# Patient Record
Sex: Male | Born: 1945 | Race: White | Hispanic: No | Marital: Married | Smoking: Never smoker
Health system: Southern US, Community
[De-identification: ages and names within clinical notes are randomized; demographics above are authoritative.]

## PROBLEM LIST (undated history)

## (undated) ENCOUNTER — Emergency Department (HOSPITAL_COMMUNITY): Admission: EM | Payer: Managed Care, Other (non HMO) | Source: Home / Self Care

## (undated) DIAGNOSIS — I10 Essential (primary) hypertension: Secondary | ICD-10-CM

## (undated) DIAGNOSIS — I219 Acute myocardial infarction, unspecified: Secondary | ICD-10-CM

## (undated) DIAGNOSIS — G459 Transient cerebral ischemic attack, unspecified: Secondary | ICD-10-CM

## (undated) DIAGNOSIS — Z86010 Personal history of colonic polyps: Secondary | ICD-10-CM

## (undated) DIAGNOSIS — Z9289 Personal history of other medical treatment: Secondary | ICD-10-CM

## (undated) DIAGNOSIS — I2510 Atherosclerotic heart disease of native coronary artery without angina pectoris: Secondary | ICD-10-CM

## (undated) DIAGNOSIS — I213 ST elevation (STEMI) myocardial infarction of unspecified site: Secondary | ICD-10-CM

## (undated) DIAGNOSIS — E785 Hyperlipidemia, unspecified: Secondary | ICD-10-CM

## (undated) DIAGNOSIS — E119 Type 2 diabetes mellitus without complications: Secondary | ICD-10-CM

## (undated) HISTORY — DX: Transient cerebral ischemic attack, unspecified: G45.9

## (undated) HISTORY — PX: COLONOSCOPY: SHX174

## (undated) HISTORY — PX: CORONARY ANGIOPLASTY WITH STENT PLACEMENT: SHX49

## (undated) HISTORY — DX: Personal history of other medical treatment: Z92.89

## (undated) HISTORY — DX: Personal history of colonic polyps: Z86.010

## (undated) HISTORY — DX: Hyperlipidemia, unspecified: E78.5

## (undated) HISTORY — DX: Essential (primary) hypertension: I10

## (undated) HISTORY — DX: Atherosclerotic heart disease of native coronary artery without angina pectoris: I25.10

## (undated) HISTORY — DX: Acute myocardial infarction, unspecified: I21.9

---

## 1997-09-07 ENCOUNTER — Encounter: Admission: RE | Admit: 1997-09-07 | Discharge: 1997-09-07 | Payer: Self-pay | Admitting: *Deleted

## 1998-12-22 ENCOUNTER — Encounter: Admission: RE | Admit: 1998-12-22 | Discharge: 1998-12-22 | Payer: Self-pay | Admitting: *Deleted

## 2000-04-08 DIAGNOSIS — G459 Transient cerebral ischemic attack, unspecified: Secondary | ICD-10-CM

## 2000-04-08 HISTORY — DX: Transient cerebral ischemic attack, unspecified: G45.9

## 2000-05-03 ENCOUNTER — Inpatient Hospital Stay (HOSPITAL_COMMUNITY): Admission: EM | Admit: 2000-05-03 | Discharge: 2000-05-06 | Payer: Self-pay

## 2000-05-30 ENCOUNTER — Encounter: Payer: Self-pay | Admitting: Cardiology

## 2000-05-30 ENCOUNTER — Ambulatory Visit (HOSPITAL_COMMUNITY): Admission: RE | Admit: 2000-05-30 | Discharge: 2000-05-30 | Payer: Self-pay

## 2005-04-08 HISTORY — PX: HERNIA REPAIR: SHX51

## 2005-12-16 ENCOUNTER — Ambulatory Visit: Payer: Self-pay | Admitting: Cardiology

## 2005-12-20 ENCOUNTER — Ambulatory Visit: Payer: Self-pay | Admitting: Cardiology

## 2007-06-27 ENCOUNTER — Emergency Department (HOSPITAL_COMMUNITY): Admission: EM | Admit: 2007-06-27 | Discharge: 2007-06-27 | Payer: Self-pay | Admitting: Emergency Medicine

## 2007-10-07 ENCOUNTER — Ambulatory Visit: Payer: Self-pay | Admitting: Internal Medicine

## 2007-10-07 ENCOUNTER — Encounter: Payer: Self-pay | Admitting: Physician Assistant

## 2007-10-07 LAB — CONVERTED CEMR LAB
Cholesterol: 165 mg/dL (ref 0–200)
Direct LDL: 106.8 mg/dL
HDL: 23.8 mg/dL — ABNORMAL LOW (ref >39.0)
Total CHOL/HDL Ratio: 6.9
Triglycerides: 208 mg/dL (ref 0–149)
VLDL: 42 mg/dL — ABNORMAL HIGH (ref 0–40)

## 2007-10-16 ENCOUNTER — Ambulatory Visit: Payer: Self-pay

## 2007-11-02 ENCOUNTER — Ambulatory Visit: Payer: Self-pay | Admitting: Cardiology

## 2008-06-03 DIAGNOSIS — I2510 Atherosclerotic heart disease of native coronary artery without angina pectoris: Secondary | ICD-10-CM

## 2008-06-03 DIAGNOSIS — R6882 Decreased libido: Secondary | ICD-10-CM

## 2008-06-03 DIAGNOSIS — E785 Hyperlipidemia, unspecified: Secondary | ICD-10-CM

## 2008-06-03 DIAGNOSIS — I10 Essential (primary) hypertension: Secondary | ICD-10-CM | POA: Insufficient documentation

## 2008-06-03 DIAGNOSIS — R42 Dizziness and giddiness: Secondary | ICD-10-CM | POA: Insufficient documentation

## 2009-08-22 ENCOUNTER — Ambulatory Visit (HOSPITAL_COMMUNITY): Admission: RE | Admit: 2009-08-22 | Discharge: 2009-08-22 | Payer: Self-pay | Admitting: Orthopedic Surgery

## 2009-12-04 ENCOUNTER — Telehealth: Payer: Self-pay | Admitting: Cardiology

## 2010-01-25 ENCOUNTER — Emergency Department (HOSPITAL_COMMUNITY): Admission: EM | Admit: 2010-01-25 | Discharge: 2010-01-26 | Payer: Self-pay | Admitting: Emergency Medicine

## 2010-01-30 ENCOUNTER — Ambulatory Visit: Payer: Self-pay | Admitting: Cardiovascular Disease

## 2010-01-30 ENCOUNTER — Inpatient Hospital Stay (HOSPITAL_COMMUNITY): Admission: EM | Admit: 2010-01-30 | Discharge: 2010-02-01 | Payer: Self-pay | Admitting: Emergency Medicine

## 2010-02-06 DEATH — deceased

## 2010-03-06 ENCOUNTER — Ambulatory Visit: Payer: Self-pay | Admitting: Cardiology

## 2010-03-29 ENCOUNTER — Telehealth: Payer: Self-pay | Admitting: Cardiology

## 2010-05-10 NOTE — Assessment & Plan Note (Signed)
Summary: f2y  Medications Added HYDROCHLOROTHIAZIDE 12.5 MG TABS (HYDROCHLOROTHIAZIDE) Take one tablet by mouth daily. NITROSTAT 0.4 MG SUBL (NITROGLYCERIN) 1 tablet under tongue at onset of chest pain; you may repeat every 5 minutes for up to 3 doses. ASPIRIN EC 325 MG TBEC (ASPIRIN) Take one tablet by mouth daily PLAVIX 75 MG TABS (CLOPIDOGREL BISULFATE) Take one tablet by mouth daily      Allergies Added: NKDA  Visit Type:  2 years follow up  CC:  Chest pains.  History of Present Illness: Mr. [name] is a 65 year old patient whom I have not seen since 2009.  He has had prior PCI of his right coronary artery in 2002.  At that time, his ejection fraction was 45-50%. Last Myoview  was performed on July 10,2009.  His ejection fraction was 61%.  There was a prior inferobasal thinning versus infarct, and there was mild inferobasal ischemia and mild apical lateral ischemia.  It was felt to be a low-risk study. Patient admitted to [facility] in October of 2011 with chest pain. Troponin mildly elevated. He underwent cardiac catheterization which revealed occlusion of the distal left circumflex. There was nonobstructive disease in the LAD. The right coronary artery revealed a 20% stenosis proximally.  In the distal RCA, there was a 95% tubular stenosis just before the previously placed stent.  The stent itself is patent with mild in-stent restenosis.  The PDA is large size with diffuse disease in the mid and distal segment with multiple lesions ranging from 90% to 95%.  First PL is small size.  Second PL is large sized and has an 80% proximal stenosis. LV function was normal. The patient had PCI of the right coronary artery with a bare-metal stent. He had PCI of the posterior lateral with a drug-eluting stent. Since then the patient denies any dyspnea on exertion, orthopnea, PND, pedal edema, palpitations, syncope or chest pain.    Current Medications (verified): 1)  Hydrochlorothiazide 12.5  Mg Tabs (Hydrochlorothiazide) .... Take One Tablet By Mouth Daily. 2)  Metoprolol Tartrate 50 Mg Tabs (Metoprolol Tartrate) .... Take One Tablet By Mouth Twice A Day 3)  Nitrostat 0.4 Mg Subl (Nitroglycerin) .Marland Kitchen.. 1 Tablet Under Tongue At Onset of Chest Pain; You May Repeat Every 5 Minutes For Up To 3 Doses. 4)  Aspirin Ec 325 Mg Tbec (Aspirin) .... Take One Tablet By Mouth Daily 5)  Plavix 75 Mg Tabs (Clopidogrel Bisulfate) .... Take One Tablet By Mouth Daily  Allergies (verified): No Known Drug Allergies  Past History:  Past Medical History: CAD, NATIVE VESSEL (ICD-414.01) HYPERTENSION, UNSPECIFIED (ICD-401.9) HYPERLIPIDEMIA-MIXED (ICD-272.4) LIBIDO, DECREASED (ICD-799.81)  Past Surgical History: Hernia repair  Social History: Reviewed history from 06/03/2008 and no changes required. Delivery driver Married  Tobacco Use - No.  Alcohol Use - no Regular Exercise - no  Review of Systems       no fevers or chills, productive cough, hemoptysis, dysphasia, odynophagia, melena, hematochezia, dysuria, hematuria, rash, seizure activity, orthopnea, PND, pedal edema, claudication. Remaining systems are negative.   Vital Signs:  Patient profile:   65 year old male Height:      72 inches Weight:      269.25 pounds BMI:     36.65 Pulse rate:   70 / minute Pulse rhythm:   regular Resp:     18 per minute BP sitting:   148 / 84  (left arm) Cuff size:   large  Vitals Entered By: Vikki Ports (March 06, 2010 10:11 AM)  Physical Exam  General:  Well-developed well-nourished in no acute distress.  Skin is warm and dry.  HEENT is normal.  Neck is supple. No thyromegaly.  Chest is clear to auscultation with normal expansion.  Cardiovascular exam is regular rate and rhythm.  Abdominal exam nontender or distended. No masses palpated. Extremities show no edema. neuro grossly intact    EKG  Procedure date:  03/06/2010  Findings:      Sinus rhythm at a rate of 67. Axis  normal. Nonspecific ST changes. Cannot rule out prior inferior infarct. First degree AV block.  Impression & Recommendations:  Problem # 1:  CAD, NATIVE VESSEL (ICD-414.01) Continue aspirin, Plavix, beta blocker. Intolerant to statins. He discontinued his Zocor secondary to myalgias and has not tolerated Lipitor or Crestor in the past. His updated medication list for this problem includes:    Metoprolol Tartrate 50 Mg Tabs (Metoprolol tartrate) .Marland Kitchen... Take one tablet by mouth twice a day    Nitrostat 0.4 Mg Subl (Nitroglycerin) .Marland Kitchen... 1 tablet under tongue at onset of chest pain; you may repeat every 5 minutes for up to 3 doses.    Aspirin Ec 325 Mg Tbec (Aspirin) .Marland Kitchen... Take one tablet by mouth daily    Plavix 75 Mg Tabs (Clopidogrel bisulfate) .Marland Kitchen... Take one tablet by mouth daily  Orders: EKG w/ Interpretation (93000)  Problem # 2:  HYPERTENSION, UNSPECIFIED (ICD-401.9) Blood pressure mildly elevated. He will follow this at home. If systolic greater than 130 or diastolic greater than 85 we will add additional medications. His updated medication list for this problem includes:    Hydrochlorothiazide 12.5 Mg Tabs (Hydrochlorothiazide) .Marland Kitchen... Take one tablet by mouth daily.    Metoprolol Tartrate 50 Mg Tabs (Metoprolol tartrate) .Marland Kitchen... Take one tablet by mouth twice a day    Aspirin Ec 325 Mg Tbec (Aspirin) .Marland Kitchen... Take one tablet by mouth daily  Problem # 3:  HYPERLIPIDEMIA-MIXED (ICD-272.4) Continue diet. Intolerant to statins.  Patient Instructions: 1)  Your physician recommends that you schedule a follow-up appointment in: 6 MONTHS WITH DR CRENSHAW 2)  Your physician recommends that you continue on your current medications as directed. Please refer to the Current Medication list given to you today.

## 2010-05-10 NOTE — Progress Notes (Signed)
Summary: dizziness when pt lays down  Phone Note Call from Patient Call back at [phone]   Caller: Spouse/patt Reason for Call: Talk to Nurse Summary of Call: Pt wife pat states pt feel sick/dizzy every time he lays down.  Initial call taken by: Roe Coombs,  March 29, 2010 9:17 AM  Follow-up for Phone Call        I talked with pt's wife--wife states night before last pt had dizziness laying in bed when he turned his head and became nauseated--wife states  pt denies any other symptoms including  chest pain/tightness/SOB/Headache/vision problems/ bleeding problems--wife states pt actually feels better today-he has not had any virus/flu symptoms and has been eating and drinking normally--wife states pt does not have a way to check his B/P --I reviewed with Dr Danie Chandler will have pt followup with PCP or Urgent Care

## 2010-05-10 NOTE — Progress Notes (Signed)
Summary: refill   Phone Note Refill Request Message from:  Patient on December 04, 2009 9:31 AM  Refills Requested: Medication #1:  HYDROCHLOROTHIAZIDE 25 MG TABS Take one tablet by mouth daily.  Medication #2:  METOPROLOL TARTRATE 50 MG TABS Take one tablet by mouth twice a day. [facility] [phone]  Initial call taken by: Judie Grieve,  December 04, 2009 9:31 AM    Prescriptions: METOPROLOL TARTRATE 50 MG TABS (METOPROLOL TARTRATE) Take one tablet by mouth twice a day  #60 x 12   Entered by:   Kem Parkinson   Authorized by:   Ferman Hamming, MD, [facility]   Signed by:   Kem Parkinson on 12/04/2009   Method used:   Electronically to        [facility]* (retail)       [address]       [place], [place]  [address]       Ph: [phone]       Fax: [phone]   RxID:   [id number] HYDROCHLOROTHIAZIDE 25 MG TABS (HYDROCHLOROTHIAZIDE) Take one tablet by mouth daily.  #30 x 12   Entered by:   Kem Parkinson   Authorized by:   Ferman Hamming, MD, [facility]   Signed by:   Kem Parkinson on 12/04/2009   Method used:   Electronically to        [facility] (retail)       [address]       [place], [place]  [address]       Ph: [phone]       Fax: [phone]   RxID:   [id number]

## 2010-05-16 ENCOUNTER — Telehealth: Payer: Self-pay | Admitting: Cardiology

## 2010-05-24 NOTE — Progress Notes (Signed)
Summary: refill  Phone Note Refill Request Message from:  Patient on May 16, 2010 9:30 AM  Refills Requested: Medication #1:  PLAVIX 75 MG TABS Take one tablet by mouth daily. Send to [facility] [phone]   Follow-up for Phone Call        faxed to pharmacy Follow-up by: Kem Parkinson,  May 17, 2010 2:34 PM    Prescriptions: PLAVIX 75 MG TABS (CLOPIDOGREL BISULFATE) Take one tablet by mouth daily  #90 x 3   Entered by:   Kem Parkinson   Authorized by:   Ferman Hamming, MD, [facility]   Signed by:   Kem Parkinson on 05/17/2010   Method used:   Printed then faxed to ...       [facility] (retail)       [address]       [place], [place]  [address]       Ph: [phone]       Fax: [phone]   RxID:   [id number] PLAVIX 75 MG TABS (CLOPIDOGREL BISULFATE) Take one tablet by mouth daily  #90 x 3   Entered by:   Kem Parkinson   Authorized by:   Ferman Hamming, MD, [facility]   Signed by:   Kem Parkinson on 05/17/2010   Method used:   Reprint   RxID:   [id number] PLAVIX 75 MG TABS (CLOPIDOGREL BISULFATE) Take one tablet by mouth daily  #90 x 3   Entered by:   Kem Parkinson   Authorized by:   Ferman Hamming, MD, [facility]   Signed by:   Kem Parkinson on 05/17/2010   Method used:   Handwritten   RxID:   [id number]

## 2010-06-20 LAB — BASIC METABOLIC PANEL
BUN: 17 mg/dL (ref 6–23)
CO2: 27 mEq/L (ref 19–32)
Calcium: 8.5 mg/dL (ref 8.4–10.5)
Calcium: 9 mg/dL (ref 8.4–10.5)
Calcium: 9.4 mg/dL (ref 8.4–10.5)
Chloride: 99 mEq/L (ref 96–112)
Creatinine, Ser: 1.26 mg/dL (ref 0.4–1.5)
Creatinine, Ser: 1.28 mg/dL (ref 0.4–1.5)
Creatinine, Ser: 1.42 mg/dL (ref 0.4–1.5)
GFR calc Af Amer: >60 mL/min (ref >60)
GFR calc Af Amer: >60 mL/min (ref >60)
GFR calc Af Amer: >60 mL/min (ref >60)
GFR calc non Af Amer: 56 mL/min — ABNORMAL LOW (ref >60)
GFR calc non Af Amer: 57 mL/min — ABNORMAL LOW (ref >60)
Sodium: 138 mEq/L (ref 135–145)
Sodium: 140 mEq/L (ref 135–145)

## 2010-06-20 LAB — CBC
HCT: 42.9 % (ref 39.0–52.0)
Hemoglobin: 13.1 g/dL (ref 13.0–17.0)
Hemoglobin: 14.4 g/dL (ref 13.0–17.0)
MCH: 29.7 pg (ref 26.0–34.0)
MCH: 29.9 pg (ref 26.0–34.0)
MCH: 30.2 pg (ref 26.0–34.0)
MCHC: 33.6 g/dL (ref 30.0–36.0)
MCV: 86.9 fL (ref 78.0–100.0)
MCV: 89.2 fL (ref 78.0–100.0)
Platelets: 131 K/uL — ABNORMAL LOW (ref 150–400)
Platelets: 133 10*3/uL — ABNORMAL LOW (ref 150–400)
Platelets: 139 10*3/uL — ABNORMAL LOW (ref 150–400)
RBC: 4.41 MIL/uL (ref 4.22–5.81)
RBC: 4.67 MIL/uL (ref 4.22–5.81)
RBC: 4.81 MIL/uL (ref 4.22–5.81)
RDW: 12.8 % (ref 11.5–15.5)
RDW: 12.9 % (ref 11.5–15.5)
WBC: 5.8 K/uL (ref 4.0–10.5)
WBC: 6.2 10*3/uL (ref 4.0–10.5)
WBC: 6.6 10*3/uL (ref 4.0–10.5)

## 2010-06-20 LAB — DIFFERENTIAL
Basophils Absolute: 0 10*3/uL (ref 0.0–0.1)
Basophils Absolute: 0 K/uL (ref 0.0–0.1)
Basophils Relative: 0 % (ref 0–1)
Eosinophils Absolute: 0.1 10*3/uL (ref 0.0–0.7)
Eosinophils Absolute: 0.1 K/uL (ref 0.0–0.7)
Eosinophils Relative: 2 % (ref 0–5)
Eosinophils Relative: 2 % (ref 0–5)
Lymphocytes Relative: 18 % (ref 12–46)
Lymphocytes Relative: 26 % (ref 12–46)
Lymphs Abs: 1.5 K/uL (ref 0.7–4.0)
Monocytes Absolute: 0.6 K/uL (ref 0.1–1.0)
Monocytes Relative: 10 % (ref 3–12)
Neutro Abs: 3.6 K/uL (ref 1.7–7.7)
Neutrophils Relative %: 62 % (ref 43–77)
Neutrophils Relative %: 72 % (ref 43–77)

## 2010-06-20 LAB — CK TOTAL AND CKMB (NOT AT ARMC): Relative Index: 2.4 (ref 0.0–2.5)

## 2010-06-20 LAB — POCT CARDIAC MARKERS
CKMB, poc: 2.6 ng/mL (ref 1.0–8.0)
Myoglobin, poc: 140 ng/mL (ref 12–200)
Myoglobin, poc: 147 ng/mL (ref 12–200)
Troponin i, poc: 0.07 ng/mL (ref 0.00–0.09)

## 2010-06-20 LAB — CARDIAC PANEL(CRET KIN+CKTOT+MB+TROPI)
CK, MB: 3.7 ng/mL (ref 0.3–4.0)
CK, MB: 5.1 ng/mL — ABNORMAL HIGH (ref 0.3–4.0)
Relative Index: 2.9 — ABNORMAL HIGH (ref 0.0–2.5)
Relative Index: 3 — ABNORMAL HIGH (ref 0.0–2.5)
Total CK: 122 U/L (ref 7–232)
Total CK: 148 U/L (ref 7–232)
Total CK: 174 U/L (ref 7–232)
Troponin I: 0.35 ng/mL — ABNORMAL HIGH (ref 0.00–0.06)

## 2010-06-20 LAB — PROTIME-INR
INR: 0.93 (ref 0.00–1.49)
Prothrombin Time: 12.7 seconds (ref 11.6–15.2)

## 2010-06-20 LAB — TROPONIN I: Troponin I: 0.3 ng/mL — ABNORMAL HIGH (ref 0.00–0.06)

## 2010-08-21 NOTE — Assessment & Plan Note (Signed)
[facility]                            CARDIOLOGY OFFICE NOTE   STRUMMER, [name]                       MRN:          [id number]  DATE:10/07/2007                            DOB:          04-30-1945    PRIMARY CARDIOLOGIST:  Madolyn Frieze. Jens Som, MD, [facility]   [name] is a pleasant 65 year old married white male, patient of Dr.  Jens Som, who has not been seen since September 2007.  He has a history  of coronary artery disease with prior inferior infarct and PCI of his  RCA.  His last nuclear study was in 2005 that showed inferior wall  ischemia, mid and basal level, but Dr. Jens Som did review this last  year and felt like it was low risk, and he has been treated medically.   The patient comes in today complaining of several-month history of  mainly dizziness, this occurs mostly when he is at rest and lasts less  than a minute.  He believes it is associated with his elevated blood  pressure.  He has no dizziness when he changes position or moves his  head, and it only happens once every 2-4 weeks.  He has not had any  syncope.  He denies any associated palpitations.  He denies chest pain.  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  He sometimes  gets out of breath when he does this, but he denies any chest pain,  tightness, pressure, dizziness, or presyncope associated with heavy  exertion.  When he saw Dr. Jens Som last, he was encouraged to resume  his Crestor, which he never did, and his Toprol was increased to 75 mg a  day, but he has only taken 25 b.i.d.  He has no primary care doctor.  The other issue he is having is decreased libido on the beta-blocker.   CURRENT MEDICATIONS:  1. Metoprolol 50 mg one half b.i.d.  2. Aspirin 81 mg daily.   PHYSICAL EXAMINATION:  GENERAL:  This is a very pleasant 65 year old  white male in no acute distress.  VITAL SIGNS:   Blood pressure 140/100, pulse 63, and weight 258.  NECK:  Without JVD, HJR, bruit, or thyroid enlargement.  LUNGS:  Clear anterior, posterior, and lateral.  HEART:  Regular rate and rhythm at 63 beats per minute.  Normal S1 and  S2.  Positive S4.  No murmur, rub, bruit, thrill, or heave noted.  ABDOMEN:  Obese.  Normoactive bowel sounds are heard throughout.  EXTREMITIES:  Without cyanosis, clubbing, or edema.  He has good distal  pulses.   IMPRESSION:  1. Dizziness, maybe related to hypertension.  2. Hypertension uncontrolled.  3. History of coronary artery disease status post myocardial      infarction treated with percutaneous coronary intervention and      stenting of his distal right coronary artery in 2002.  4. History of mixed hyperlipidemia.  5. Decreased libido on beta-blocker.   PLAN:  At this time, I talked to the patient  about switching his beta-  blocker to another antihypertensive, but I also feel that he should be  on it for his coronary artery disease.  At this time, he would like to  stay on a beta-blocker, and I have increased it to 50 mg b.i.d.  I have  also added hydrochlorothiazide 25 mg daily for better blood pressure  control, and I gave him a prescription for Crestor 10 mg daily.  We will  check a fasting lipid panel today.  I have asked him to decrease his  sodium intake and try to lose some weight.  We have scheduled stress  Cardiolite to be performed within the next couple weeks.  The patient  lost his job and will be losing his insurance shortly.  I have also told  the patient that he needs a primary care physician, which he will  consider.  He should see Dr. Jens Som back within the month after these  tests are performed.      [name], PA-C  Electronically Signed      [name], MD  Electronically Signed   ML/MedQ  DD: 10/07/2007  DT: 10/08/2007  Job #: [id number]

## 2010-08-21 NOTE — Assessment & Plan Note (Signed)
[facility]                            CARDIOLOGY OFFICE NOTE   NAME:BAYNESPranish, Akhavan                       MRN:          [id number]  DATE:11/02/2007                            DOB:          1946-02-03    Mr. Antos is a 65 year old patient whom I have not seen since 2007.  He  has had prior PCI of his right coronary artery in 2002.  At that time,  his ejection fraction was 45-50%.  He has not been seen in this office  since December 20, 2005, by myself.  However, he recently was  complaining of dizziness and was seen by [name] on October 07, 2007.  She scheduled him to have a Myoview which was performed on October 16, 2007.  His ejection fraction was 61%.  There was a prior inferobasal  thinning versus infarct, and there was mild inferobasal ischemia and  mild apical lateral ischemia.  It was felt to be a low-risk study.  Since then, he denies any chest pain, shortness of breath, palpitations,  or syncope.  There is no pedal edema.   His medications include,  1. Aspirin 81 mg p.o. daily.  2. Lopressor 50 mg p.o. b.i.d.  3. Hydrochlorothiazide 25 mg p.o. daily.  4. Fish oil.  5. Green tea.   PHYSICAL EXAMINATION:  VITAL SIGNS:  Today, blood pressure of 127/74 and  his pulse is 63.  He weighs 258 pounds.  HEENT:  Normal.  NECK:  Supple.  No bruits.  CHEST:  Clear.  CARDIOVASCULAR:  Regular rate and rhythm.  ABDOMEN:  No tenderness.  EXTREMITIES:  No edema.   DIAGNOSES:  1. Coronary artery disease - Mr. Zechman' stress Myoview is low risk.      We will continue with medical therapy to include his aspirin and      Lopressor.  I have stressed the importance of a statin.  He      apparently has had some myalgias with Lipitor in the past.  He will      consider Pravachol 40 mg p.o. daily; and if he is willing to take      this, then we will check lipids and liver in 6 weeks and I have      explained the importance of followup with laboratories.   He also      will continue with diet and exercise.  He does not smoke.  2. Hypertension - His blood pressure is much better compared to October 07, 2007.  At that time, Elon Jester had added hydrochlorothiazide and      increased his Lopressor.  I will check a BMET to follow his      potassium and renal function.  3. History of hyperlipidemia - He will consider Pravachol 40 mg p.o.      daily as outlined above.   We will see him back in 6 months.  Note, he is having insurance problems  and states that he may not be able to return in the future for followup.     Arlys John  S. Jens Som, MD, [facility]  Electronically Signed    BSC/MedQ  DD: 11/02/2007  DT: 11/03/2007  Job #: [id number]

## 2010-08-24 NOTE — Assessment & Plan Note (Signed)
[facility]                              CARDIOLOGY OFFICE NOTE   NAME:[name], [name]                       MRN:          [id number]  DATE:12/20/2005                            DOB:          04/20/1945    [name] is a gentleman who has a history of coronary disease with prior  inferior infarct and PCI of his right coronary artery.  His most recent  nuclear study was performed on Aug 24, 2003.  His ejection fraction was 59%.  Dr. Eden Emms interpreted the study as ischemia to mid and basal level.  I did  review this and felt it was low risk and we have been treating medically.  Since that time he denies any dyspnea, chest pain, palpitations, or syncope.  There is no claudication.   His medications include:  1. Toprol 50 mg p.o. q.day.  2. Aspirin 18 mg p.o. q.day.   PHYSICAL EXAM TODAY:  VITAL SIGNS:  Blood pressure 132/96.  Pulse 71.  Weight 243 pounds.  NECK:  Supple with no bruits.  CHEST:  Clear.  CARDIOVASCULAR EXAM:  Reveals a regular rate and rhythm.  ABDOMINAL EXAM:  Shows no pulsatile masses.  No bruits.  He has 2+ femoral  pulses bilaterally.  EXTREMITIES:  Show no edema.   His electrocardiogram shows a normal sinus rhythm at a rate of 71.  The axis  is normal.  There are no ST changes noted.   DIAGNOSES:  1. History of coronary disease.  2. Hypertension.  3. History of mild hyperlipidemia.   PLAN:  [name] is doing well from a symptomatic standpoint.  I did offer  a repeat nuclear study today but he declined.  His blood pressure is  elevated.  I have asked him to increase his Toprol to 75 mg p.o. daily.  I  have also asked him to purchase a home blood pressure monitor and if his  blood pressure continues to run high, then he will contact us (we would like  his systolic to be less than 130 and his diastolic less than 80).  He is  also not on a Statin and has NOT TOLERATED ANY STATIN BUT CRESTOR in the  past.  He discontinued  this medication on his own for unclear reasons.  I  have asked him to begin Crestor 10 mg every other day.  He is not  clear that he will agree to this.  If so, we will ask him to return for a  fasting lipids and liver in six weeks.  We discussed the importance of diet  and exercise.  He will see [place] back in 12 months.                              Madolyn Frieze Jens Som, MD, [facility]    BSC/MedQ  DD:  12/20/2005  DT:  12/21/2005  Job #:  [id number]

## 2010-09-10 ENCOUNTER — Telehealth: Payer: Self-pay | Admitting: Cardiology

## 2010-09-10 MED ORDER — CLOPIDOGREL BISULFATE 75 MG PO TABS: 75.0000 mg | ORAL_TABLET | Freq: Every day | ORAL | Status: DC

## 2010-09-10 NOTE — Telephone Encounter (Signed)
Refill medication- plavix 75 mg [facility] [phone].

## 2010-12-19 ENCOUNTER — Other Ambulatory Visit: Payer: Self-pay | Admitting: *Deleted

## 2010-12-19 MED ORDER — METOPROLOL TARTRATE 50 MG PO TABS: 50.0000 mg | ORAL_TABLET | Freq: Two times a day (BID) | ORAL | Status: DC

## 2011-01-02 ENCOUNTER — Telehealth: Payer: Self-pay | Admitting: Cardiology

## 2011-01-02 MED ORDER — METOPROLOL TARTRATE 50 MG PO TABS: 50.0000 mg | ORAL_TABLET | Freq: Two times a day (BID) | ORAL | Status: DC

## 2011-01-02 NOTE — Telephone Encounter (Signed)
Pt is calling back about refill request he left earlier he is totally out of pills he is upset

## 2011-01-02 NOTE — Telephone Encounter (Signed)
Pt calling needing hydrochlorothizide 25 mg called in. Pt was told to take 1/2 pill per day pt would like to know if he could get 12.5 mg tablets   Pt would like one year supply of RX.   Pt would also like one year supply of metoprolol 50 mg called in.

## 2011-01-03 ENCOUNTER — Other Ambulatory Visit: Payer: Self-pay | Admitting: *Deleted

## 2011-01-03 MED ORDER — HYDROCHLOROTHIAZIDE 25 MG PO TABS: 25.0000 mg | ORAL_TABLET | Freq: Every day | ORAL | Status: DC

## 2011-01-07 ENCOUNTER — Ambulatory Visit (INDEPENDENT_AMBULATORY_CARE_PROVIDER_SITE_OTHER): Payer: Medicare Other | Admitting: General Surgery

## 2011-01-07 ENCOUNTER — Ambulatory Visit
Admission: RE | Admit: 2011-01-07 | Discharge: 2011-01-07 | Disposition: A | Discharge status: Home / Self Care | Payer: Medicare Other | Source: Ambulatory Visit | Attending: General Surgery | Admitting: General Surgery

## 2011-01-07 ENCOUNTER — Other Ambulatory Visit (INDEPENDENT_AMBULATORY_CARE_PROVIDER_SITE_OTHER): Payer: Self-pay | Admitting: General Surgery

## 2011-01-07 ENCOUNTER — Encounter (INDEPENDENT_AMBULATORY_CARE_PROVIDER_SITE_OTHER): Payer: Self-pay | Admitting: General Surgery

## 2011-01-07 VITALS — BP 180/102 | HR 60 | Temp 98.2°F | Resp 12 | Ht 72.0 in | Wt 269.8 lb

## 2011-01-07 DIAGNOSIS — R1032 Left lower quadrant pain: Secondary | ICD-10-CM

## 2011-01-07 NOTE — Progress Notes (Signed)
Chief Complaint  Patient presents with  . Other    new pt- eval [facility]    HPI [name]. is a 65 y.o. male.   HPI This is a 65 year old male who had a left inguinal hernia repair with mesh in 2007 and aspirin. This was done as he had left groin pain with a hernia preoperatively. He said that the surgeon told him the hernia was much bigger than what he thought. He did well for a while postoperatively. He began developing left groin pain that has been present after that. This is been present for some time now. It is an occasional intermittent pain he describes. Occasionally this is worse when he is having intercourse. He also states that over the last week this has gotten acutely worse and has been more painful and more tender. He doesn't realize an event cause this. He does not describe a bulge in his groin at this point. Past Medical History  Diagnosis Date  . CHF (congestive heart failure)   . Hyperlipidemia   . Hypertension   . Heart attack 2011  . TIA (transient ischemic attack) 2002    Past Surgical History  Procedure Date  . Hernia repair 2007    [facility]    Family History  Problem Relation Age of Onset  . Heart disease Mother     Social History History  Substance Use Topics  . Smoking status: Never Smoker   . Smokeless tobacco: Not on file  . Alcohol Use: No    No Known Allergies  Current Outpatient Prescriptions  Medication Sig Dispense Refill  . aspirin 325 MG tablet Take 325 mg by mouth daily.        . hydrochlorothiazide (HYDRODIURIL) 25 MG tablet Take 1 tablet (25 mg total) by mouth daily.  30 tablet  6  . metoprolol (LOPRESSOR) 50 MG tablet Take 1 tablet (50 mg total) by mouth 2 (two) times daily.  60 tablet  4    Review of Systems Review of Systems  Gastrointestinal: Positive for abdominal pain.  All other systems reviewed and are negative.    Blood pressure 180/102, pulse 60, temperature 98.2 F (36.8 C), temperature source Temporal, resp. rate 12,  height 6' (1.829 m), weight 269 lb 12.8 oz (122.38 kg).  Physical Exam Physical Exam  Constitutional: He appears well-developed and well-nourished.  Abdominal: Soft. There is no tenderness. Hernia confirmed negative in the right inguinal area and confirmed negative in the left inguinal area.  Genitourinary: Testes normal and penis normal.     Lymphadenopathy:       Right: No inguinal adenopathy present.       Left: No inguinal adenopathy present.      Assessment    Left groin pain s/ pLIH repair in 2007     Plan    He has a prior history of hernia repair and has acute on some chronic groin pain. I do not feel a hernia at this point. We discussed that this is likely musculoskeletal in nature but due to the chronicity as well as his concern we will make sure there is no anatomic abnormality with a ct of his pelvis.  We discussed if that is okay there are certainly some conservative measures to take including rest, physical therapy, medications, injections that might be a reasonable plan. He is going to follow up after he undergoes a CT scan. I will also obtain his operative report from prior repair.  [name] 01/07/2011, 3:09 PM

## 2011-01-08 ENCOUNTER — Telehealth (INDEPENDENT_AMBULATORY_CARE_PROVIDER_SITE_OTHER): Payer: Self-pay

## 2011-01-08 NOTE — Telephone Encounter (Signed)
Called pt to notify him that I did fax the request to get his operative report from 2007 from DR Gaetano Net. I did get Dr Dwain Sarna to review pt's CT scan and the report does show bilateral inguinal hernia's with an umbilical hernia. Dr Dwain Sarna does want pt to see Dr Michaell Cowing for an opinion on lap.hernia repair of the 3 hernia's that has just been found on the CT scan. The pt understands our discussion and I made his appt with Dr Michaell Cowing for 01-16-11.Hulda Humphrey

## 2011-01-11 ENCOUNTER — Ambulatory Visit (INDEPENDENT_AMBULATORY_CARE_PROVIDER_SITE_OTHER): Payer: Self-pay | Admitting: General Surgery

## 2011-01-16 ENCOUNTER — Encounter (INDEPENDENT_AMBULATORY_CARE_PROVIDER_SITE_OTHER): Payer: Self-pay | Admitting: Surgery

## 2011-01-16 ENCOUNTER — Ambulatory Visit (INDEPENDENT_AMBULATORY_CARE_PROVIDER_SITE_OTHER): Payer: Medicare Other | Admitting: Surgery

## 2011-01-16 ENCOUNTER — Encounter (INDEPENDENT_AMBULATORY_CARE_PROVIDER_SITE_OTHER): Payer: Self-pay

## 2011-01-16 VITALS — BP 152/90 | HR 66 | Temp 97.6°F | Resp 16 | Ht 72.0 in | Wt 270.2 lb

## 2011-01-16 DIAGNOSIS — K4091 Unilateral inguinal hernia, without obstruction or gangrene, recurrent: Secondary | ICD-10-CM | POA: Insufficient documentation

## 2011-01-16 DIAGNOSIS — K4090 Unilateral inguinal hernia, without obstruction or gangrene, not specified as recurrent: Secondary | ICD-10-CM

## 2011-01-16 DIAGNOSIS — K429 Umbilical hernia without obstruction or gangrene: Secondary | ICD-10-CM

## 2011-01-16 NOTE — Progress Notes (Signed)
Subjective:     Patient ID: Andrew Limbo., male   DOB: [birth year], 65 y.o.   MRN: [id number]  HPI  Patient Care Team: Provider Not In System as PCP - General Lewayne Bunting, MD as Consulting Physician (Cardiology)  This patient is a 65 y.o.male who presents today for surgical evaluation.   Reason for visit: Probable bilateral inguinal hernias and umbilical hernia. Consideration of laparoscopic repair.  Patient is an obese male who had a left inguinal hernia repair done in [place] 2009 open with mesh. Patient notes some left groin pain that started about a month ago. It has intensified. It is worse with activity. He is normally rather active. Negative colonoscopy 3 years ago. No history of infections. No MRSA  There were suspicion of hernia. CT scan confirmed hernias at his bellybutton and in both groins. He was referred to me by Dr. Dwain Sarna for consideration of laparoscopic repair of all areas.  Past Medical History  Diagnosis Date  . CHF (congestive heart failure)   . Hyperlipidemia   . Hypertension   . Heart attack 2011  . TIA (transient ischemic attack) 2002    Past Surgical History  Procedure Date  . Hernia repair 2007    LIH    History   Social History  . Marital Status: Married    Spouse Name: N/A    Number of Children: N/A  . Years of Education: N/A   Occupational History  . Not on file.   Social History Main Topics  . Smoking status: Never Smoker   . Smokeless tobacco: Not on file  . Alcohol Use: No  . Drug Use: No  . Sexually Active:    Other Topics Concern  . Not on file   Social History Narrative  . No narrative on file    Family History  Problem Relation Age of Onset  . Heart disease Mother     Current outpatient prescriptions:aspirin 325 MG tablet, Take 325 mg by mouth daily.  , Disp: , Rfl: ;  hydrochlorothiazide (HYDRODIURIL) 25 MG tablet, Take 1 tablet (25 mg total) by mouth daily., Disp: 30 tablet, Rfl: 6;  metoprolol (LOPRESSOR)  50 MG tablet, Take 1 tablet (50 mg total) by mouth 2 (two) times daily., Disp: 60 tablet, Rfl: 4  No Known Allergies     Review of Systems  Constitutional: Negative for fever, chills and diaphoresis.  HENT: Negative for nosebleeds, sore throat, facial swelling, mouth sores, trouble swallowing and ear discharge.   Eyes: Negative for photophobia, discharge and visual disturbance.  Respiratory: Negative for cough, choking, chest tightness, shortness of breath and stridor.   Cardiovascular: Negative for chest pain, palpitations and leg swelling.       Walks 2 miles w/o problem.  Works on farm  Gastrointestinal: Negative for nausea, vomiting, abdominal pain, diarrhea, constipation, blood in stool, abdominal distention, anal bleeding and rectal pain.  Genitourinary: Negative for dysuria, urgency, frequency, flank pain, penile swelling, scrotal swelling, difficulty urinating, penile pain and testicular pain.       L groin pain moderate  Musculoskeletal: Negative for myalgias, back pain, arthralgias and gait problem.  Skin: Negative for color change, pallor, rash and wound.       No MRSA  Neurological: Negative for dizziness, speech difficulty, weakness, numbness and headaches.  Hematological: Negative for adenopathy. Does not bruise/bleed easily.  Psychiatric/Behavioral: Negative for hallucinations, confusion and agitation.       Objective:   Physical Exam  Constitutional: He is oriented  to person, place, and time. He appears well-developed and well-nourished. No distress.  HENT:  Head: Normocephalic.  Mouth/Throat: Oropharynx is clear and moist. No oropharyngeal exudate.  Eyes: Conjunctivae and EOM are normal. Pupils are equal, round, and reactive to light. No scleral icterus.  Neck: Normal range of motion. Neck supple. No tracheal deviation present.  Cardiovascular: Normal rate, regular rhythm and intact distal pulses.   Pulmonary/Chest: Effort normal and breath sounds normal. No  respiratory distress.  Abdominal: Soft. He exhibits no distension. There is no tenderness. There is no rebound and no guarding. Hernia confirmed negative in the right inguinal area and confirmed negative in the left inguinal area.       Morbidly obese with supraumb diastasis recti.  ~1cm umb hernia  Genitourinary:     Musculoskeletal: Normal range of motion. He exhibits no tenderness.  Lymphadenopathy:    He has no cervical adenopathy.       Right: No inguinal adenopathy present.       Left: No inguinal adenopathy present.  Neurological: He is alert and oriented to person, place, and time. No cranial nerve deficit. He exhibits normal muscle tone. Coordination normal.  Skin: Skin is warm and dry. No rash noted. He is not diaphoretic. No erythema. No pallor.  Psychiatric: He has a normal mood and affect. His behavior is normal. Judgment and thought content normal.       Assessment:     BIH, left recurrent & umb hernia    Plan:     I think he could benefit from repair. I should be able to do it laparoscopically.  The anatomy & physiology of the abdominal wall was discussed.  The pathophysiology of hernias was discussed.  Natural history risks without surgery of enlargement, pain, incarceration & strangulation was discussed.   Contributors to complications such as smoking, obesity, diabetes, prior surgery, etc were discussed.  I feel the risks of no intervention will lead to serious problems that outweigh the operative risks; therefore, I recommended surgery to reduce and repair the hernia.  I explained laparoscopic techniques with possible need for an open approach.  I noted the probable use of mesh to patch and/or buttress hernia repair.  As long as the patient can minimize risk factors, I given a high probability this will help resolve the issue.  Risks such as bleeding, infection, abscess, need for further treatment, heart attack, death, and other risks were discussed.  Goals of  post-operative recovery were discussed as well.  Possibility that this will not correct all symptoms was explained.  I stressed the importance of low-impact activity, aggressive pain control, avoiding constipation, & not pushing through pain to minimize risk of post-operative chronic pain or injury. Possibility of reherniation was discussed.  We will work to minimize complications.   An educational handout further explaining the pathology & treatment options was given as well.  Questions were answered.  The patient expresses understanding & wishes to proceed with surgery.

## 2011-01-16 NOTE — Patient Instructions (Addendum)
Hernia A hernia occurs when an internal organ pushes out through a weak spot in the belly (abdominal) wall. Hernias most commonly occur in the groin and around the navel. Hernias also can occur through by cut (incision) made by the surgeon after an abdominal operation. Hernias often can be pushed back into place (reduced). Most hernias tend to get worse over time. Problems occur when abdominal contents get stuck in the opening (incarcerated hernia). The blood supply becomes blocked or impaired (strangulated hernia). Because of these risks, you may require surgery to repair the hernia. CAUSES  Heavy lifting.   Prolonged coughing.   Straining to move your bowels.   Hernias can also occur through a cut (incision) by a surgeon after an abdominal operation.  HOME CARE INSTRUCTIONS  Bed rest is not required. You may continue your normal activities. Avoid heavy lifting (more than 10 pounds) or straining. Cough gently. If you are a smoker it is best to stop. Even the best hernia repair can break down with the continual strain of coughing. Even if you do not have your hernia repaired, a cough will continue to aggravate the problem.   Do not wear anything tight over your hernia. Do not try to keep it in with an outside bandage or truss. These can damage abdominal contents if they are trapped within the hernia sac.   Eat a normal diet. Avoid constipation. Straining over long periods of time will increase hernia size and encourage breakdown of repairs. If you cannot do this with diet alone, stool softeners may be used.  SEEK IMMEDIATE MEDICAL CARE IF: You have problems (symptoms) of a trapped (incarcerated) hernia:  You develop an oral temperature above 102F, or as your caregiver suggests.   You develop increasing abdominal pain.   You feel sick to your stomach (nausea) and vomiting.   The hernia is stuck outside the abdomen, looks discolored, feels hard, or is tender.   You have any changes in your  bowel habits or in the hernia that is unusual for you.   You have increased pain or swelling around the hernia.   You cannot push the hernia back in place by applying gentle pressure while lying down.  MAKE SURE YOU:   Understand these instructions.   Will watch your condition.   Will get help right away if you are not doing well or get worse.  Document Released: 03/25/2005 Document Re-Released: 01/20/2009 [facility] Patient Information 2011 [place], [place].  Managing Pain  Pain after surgery or related to activity is often due to strain/injury to muscle, tendon, nerves and/or incisions.  This pain is usually short-term and will improve in a few months.   Many people find it helpful to do the following things TOGETHER to help speed the process of healing and to get back to regular activity more quickly:  1. Avoid heavy physical activity a.  no lifting greater than 20 pounds b. Do not "push through" the pain.  Listen to your body and avoid positions and maneuvers than reproduce the pain c. Walking is okay as tolerated, but go slowly and stop when getting sore.  d. Remember: If it hurts to do it, then don't do it! 2. Take Anti-inflammatory medication  a. Take with food/snack around the clock for 1-2 weeks i. This helps the muscle and nerve tissues become less irritable and calm down faster b. Choose ONE of the following over-the-counter medications: i. Naproxen 220mg  tabs (ex. Aleve) 1-2 pills twice a day  ii. Ibuprofen  200mg  tabs (ex. Advil, Motrin) 3-4 pills with every meal and just before bedtime iii. Acetaminophen 500mg  tabs (Tylenol) 1-2 pills with every meal and just before bedtime 3. Use a Heating pad or Ice/Cold Pack a. 4-6 times a day b. May use warm bath/hottub  or showers 4. Try Gentle Massage and/or Stretching  a. at the area of pain many times a day b. stop if you feel pain - do not overdo it  Try these steps together to help you body heal faster and avoid making  things get worse.  Doing just one of these things may not be enough.    If you are not getting better after two weeks or are noticing you are getting worse, contact our office for further advice; we may need to re-evaluate you & see what other things we can do to help.

## 2011-01-17 ENCOUNTER — Telehealth: Payer: Self-pay | Admitting: *Deleted

## 2011-01-17 ENCOUNTER — Ambulatory Visit: Payer: Medicare Other | Admitting: Physician Assistant

## 2011-01-17 NOTE — Telephone Encounter (Deleted)
Pt returning your call

## 2011-01-17 NOTE — Telephone Encounter (Signed)
pt could not come in today for surg clearance but will come in 01/18/11 @ 8:30 to see SW. Danielle Rankin

## 2011-01-18 ENCOUNTER — Ambulatory Visit (INDEPENDENT_AMBULATORY_CARE_PROVIDER_SITE_OTHER): Payer: Medicare Other | Admitting: Physician Assistant

## 2011-01-18 ENCOUNTER — Encounter: Payer: Self-pay | Admitting: Physician Assistant

## 2011-01-18 VITALS — BP 146/86 | HR 58 | Ht 72.0 in | Wt 273.0 lb

## 2011-01-18 DIAGNOSIS — Z01810 Encounter for preprocedural cardiovascular examination: Secondary | ICD-10-CM

## 2011-01-18 DIAGNOSIS — E785 Hyperlipidemia, unspecified: Secondary | ICD-10-CM

## 2011-01-18 DIAGNOSIS — I10 Essential (primary) hypertension: Secondary | ICD-10-CM

## 2011-01-18 DIAGNOSIS — I2510 Atherosclerotic heart disease of native coronary artery without angina pectoris: Secondary | ICD-10-CM

## 2011-01-18 NOTE — Assessment & Plan Note (Signed)
Uncontrolled.  However, he is in a great deal of pain.  Continue to monitor for now.  If still elevated in followup, consider adding amlodipine.

## 2011-01-18 NOTE — Patient Instructions (Signed)
Your physician recommends that you schedule a follow-up appointment in: 02/26/11 8:45 TO SEE DR. CRENSHAW  NO CHANGES TODAY

## 2011-01-18 NOTE — Assessment & Plan Note (Signed)
He currently does not have any unstable cardiac conditions.  He can achieve more than 4 METS without chest pain or shortness of breath.   According to [facility] and AHA guidelines, the patient requires no further cardiac workup prior to their noncardiac surgery.  The patient should be at acceptable risk.  Our service is available as necessary in the perioperative period.  He took himself off of Plavix 6 months ago.  We discussed the importance of discussing whether or not to come off of these medications with cardiology prior to discontinuation.  At this point, he is 12 months out since his PCI.  He can continue on aspirin only for now.  I have suggested that he continue his beta blocker throughout the perioperative period to reduce cardiovascular risk.  He can followup with Dr. Jens Som as scheduled.

## 2011-01-18 NOTE — Progress Notes (Signed)
History of Present Illness: Primary Cardiologist: Dr. Floydene Flock Jamy Cleckler. is a 65 y.o. male presents for surgical clearance.  He has a history of CAD, status post PCI to the RCA in 2002.  He presented in 10/11 with an NSTEMI.  Cardiac catheterization 02/01/10: Proximal LAD 30%, mid LAD 30-40%, proximal circumflex 20%, mid circumflex 60%, distal circumflex occluded, proximal RCA 20%, distal RCA 95%, RCA stent patent, mid to distal PDA 90-95% in multiple areas, proximal PL2 80%, EF 55%.  His distal circumflex and PDA were both treated medically.  He underwent bare-metal stent placement to the distal RCA and drug-eluting stent placement to the PL2.  He was last seen by Dr. Jens Som in 11/11.  He needs bilateral inguinal hernia repair and umbilical hernia repair with Dr. Michaell Cowing in near future.  He stopped his Plavix 6 months ago due to myalgias/arthralgias.  The patient denies chest pain, shortness of breath, syncope, orthopnea, PND or significant pedal edema.  He still works.  He does his own yard work.  He can go up and down steps.  He can clearly exert more than 4 METS without chest pain or dyspnea.    Past Medical History  Diagnosis Date  . CAD (coronary artery disease)     a. s/p PCI to RCA 2002;  b. NSTEMI 10/11: cath with pLAD 30%, mLAD 30-40%, pCFX 20%, mCFX 60%, dCFX occluded (med Rx); pRCA 20%, dRCA 95% before stent tx with BMS, RCA stent ok, m-d PDA 90-95% (med Rx); pPL2 80% tx with DES; EF 55%  . Hyperlipidemia     intolerant to statins  . Hypertension   . TIA (transient ischemic attack) 2002  . Glucose intolerance (impaired glucose tolerance)     Current Outpatient Prescriptions  Medication Sig Dispense Refill  . aspirin 325 MG tablet Take 325 mg by mouth daily.        . hydrochlorothiazide (HYDRODIURIL) 25 MG tablet Take 1 tablet (25 mg total) by mouth daily.  30 tablet  6  . metoprolol (LOPRESSOR) 50 MG tablet Take 1 tablet (50 mg total) by mouth 2 (two) times daily.   60 tablet  4    Allergies: No Known Allergies  Social history:  Nonsmoker  ROS:  Please see the history of present illness.  All other systems reviewed and negative.   Vital Signs: BP 146/86  Pulse 58  Ht 6' (1.829 m)  Wt 273 lb (123.832 kg)  BMI 37.03 kg/m2  PHYSICAL EXAM: Well nourished, well developed, in no acute distress HEENT: normal Neck: no JVD Endocrine: No thyromegaly Vascular: No carotid bruits Cardiac:  normal S1, S2; RRR; no murmur Lungs:  Decreased breath sounds bilaterally, no wheezing, rhonchi or rales Abd: soft, nontender, no hepatomegaly Ext: no edema Skin: warm and dry Neuro:  CNs 2-12 intact, no focal abnormalities noted  EKG:  Sinus bradycardia, rate 58, normal axis, first degree AV block with PR interval of 236 ms, no ischemic changes  ASSESSMENT AND PLAN:

## 2011-01-18 NOTE — Assessment & Plan Note (Signed)
He cannot take statins.Continue diet therapy.

## 2011-01-21 ENCOUNTER — Encounter (INDEPENDENT_AMBULATORY_CARE_PROVIDER_SITE_OTHER): Payer: Medicare Other | Admitting: General Surgery

## 2011-02-04 DIAGNOSIS — K4020 Bilateral inguinal hernia, without obstruction or gangrene, not specified as recurrent: Secondary | ICD-10-CM

## 2011-02-04 DIAGNOSIS — K429 Umbilical hernia without obstruction or gangrene: Secondary | ICD-10-CM

## 2011-02-04 HISTORY — PX: HERNIA REPAIR: SHX51

## 2011-02-15 ENCOUNTER — Telehealth (INDEPENDENT_AMBULATORY_CARE_PROVIDER_SITE_OTHER): Payer: Self-pay

## 2011-02-15 NOTE — Telephone Encounter (Signed)
Pt calling in b/c still having a lot of pain after lap bil.ing hernia repair and umb. Hernia repair. The pt is no longer taking pain med. B/c he said it contsitpates him too bad and he is not taking anything for the pain. I advised the pt to use heat around the clock and to take Advil or Ibuprofen 4tabs 4xdaily or Aleve 2tabs twice a day. I made a follow up appt for pt to see Dr Michaell Cowing on Monday. I advised the pt to do the heat and antinflamatorys this whole weekend b/c Dr Michaell Cowing would ask him on Monday./ [facility]

## 2011-02-18 ENCOUNTER — Encounter (INDEPENDENT_AMBULATORY_CARE_PROVIDER_SITE_OTHER): Payer: Self-pay | Admitting: Surgery

## 2011-02-18 ENCOUNTER — Ambulatory Visit (INDEPENDENT_AMBULATORY_CARE_PROVIDER_SITE_OTHER): Payer: Medicare Other | Admitting: Surgery

## 2011-02-18 VITALS — BP 166/86 | HR 60 | Temp 98.1°F | Resp 20 | Ht 72.0 in | Wt 273.1 lb

## 2011-02-18 DIAGNOSIS — K4090 Unilateral inguinal hernia, without obstruction or gangrene, not specified as recurrent: Secondary | ICD-10-CM

## 2011-02-18 DIAGNOSIS — R1032 Left lower quadrant pain: Secondary | ICD-10-CM

## 2011-02-18 DIAGNOSIS — K4091 Unilateral inguinal hernia, without obstruction or gangrene, recurrent: Secondary | ICD-10-CM

## 2011-02-18 DIAGNOSIS — K429 Umbilical hernia without obstruction or gangrene: Secondary | ICD-10-CM

## 2011-02-18 MED ORDER — TRAMADOL HCL 50 MG PO TABS: 50.0000 mg | ORAL_TABLET | Freq: Four times a day (QID) | ORAL | Status: AC | PRN

## 2011-02-18 NOTE — Progress Notes (Signed)
Subjective:     Patient ID: Andrew Limbo., male   DOB: 1945-09-04, 65 y.o.   MRN: [id number]  HPI  Patient Care Team: Provider Not In System as PCP - General Lewayne Bunting, MD as Consulting Physician (Cardiology)  This patient is a 65 y.o.male who presents today for surgical evaluation.   Procedure: Laparoscopic bilateral inguinal and umbilical hernia repairs 02/04/2011  Patient comes in today with some sort of stone in his left groin. He surprised he still has some. He stopped taking narcotics and submitted for constipated. Uses ibuprofen 800 q.i.d. He moves around okay but the pain can be annoying at times. Urinating okay. No fevers chills or sweats.  Past Medical History  Diagnosis Date  . CAD (coronary artery disease)     a. s/p PCI to RCA 2002;  b. NSTEMI 10/11: cath with pLAD 30%, mLAD 30-40%, pCFX 20%, mCFX 60%, dCFX occluded (med Rx); pRCA 20%, dRCA 95% before stent tx with BMS, RCA stent ok, m-d PDA 90-95% (med Rx); pPL2 80% tx with DES; EF 55%  . Hyperlipidemia     intolerant to statins  . Hypertension   . TIA (transient ischemic attack) 2002  . Glucose intolerance (impaired glucose tolerance)     Past Surgical History  Procedure Date  . Hernia repair 2007    LIH  . Hernia repair 02/04/11    BIH repair     History   Social History  . Marital Status: Married    Spouse Name: N/A    Number of Children: N/A  . Years of Education: N/A   Occupational History  . Not on file.   Social History Main Topics  . Smoking status: Never Smoker   . Smokeless tobacco: Never Used  . Alcohol Use: No  . Drug Use: No  . Sexually Active:    Other Topics Concern  . Not on file   Social History Narrative  . No narrative on file    Family History  Problem Relation Age of Onset  . Heart disease Mother     Current outpatient prescriptions:aspirin 325 MG tablet, Take 325 mg by mouth daily.  , Disp: , Rfl: ;  hydrochlorothiazide (HYDRODIURIL) 25 MG tablet, Take 1  tablet (25 mg total) by mouth daily., Disp: 30 tablet, Rfl: 6;  metoprolol (LOPRESSOR) 50 MG tablet, Take 1 tablet (50 mg total) by mouth 2 (two) times daily., Disp: 60 tablet, Rfl: 4 traMADol (ULTRAM) 50 MG tablet, Take 1-2 tablets (50-100 mg total) by mouth every 6 (six) hours as needed for pain. Maximum dose= 8 tablets per day, Disp: 30 tablet, Rfl: 1  No Known Allergies  '  Review of Systems  Constitutional: Negative for fever, chills and diaphoresis.  HENT: Negative for sore throat, trouble swallowing and neck pain.   Eyes: Negative for photophobia and visual disturbance.  Respiratory: Negative for choking and shortness of breath.   Cardiovascular: Negative for chest pain and palpitations.  Gastrointestinal: Negative for nausea, vomiting, abdominal distention, anal bleeding and rectal pain.  Genitourinary: Positive for testicular pain. Negative for dysuria, urgency, decreased urine volume, discharge, penile swelling, scrotal swelling, difficulty urinating and penile pain.  Musculoskeletal: Negative for myalgias, arthralgias and gait problem.  Skin: Negative for color change and rash.  Neurological: Negative for dizziness, speech difficulty, weakness and numbness.  Hematological: Negative for adenopathy.  Psychiatric/Behavioral: Negative for hallucinations, confusion and agitation.       Objective:   Physical Exam  Constitutional: He is oriented  to person, place, and time. He appears well-developed and well-nourished. No distress.  HENT:  Head: Normocephalic.  Mouth/Throat: Oropharynx is clear and moist. No oropharyngeal exudate.  Eyes: Conjunctivae and EOM are normal. Pupils are equal, round, and reactive to light. No scleral icterus.  Neck: Normal range of motion. No tracheal deviation present.  Cardiovascular: Normal rate, normal heart sounds and intact distal pulses.   Pulmonary/Chest: Effort normal. No respiratory distress.  Abdominal: Soft. He exhibits no distension. There  is no tenderness. Hernia confirmed negative in the right inguinal area and confirmed negative in the left inguinal area.       Incisions clean with normal healing ridges.  No hernias  Genitourinary: Penis normal.       Mild left groin soreness  Musculoskeletal: Normal range of motion. He exhibits no tenderness.  Neurological: He is alert and oriented to person, place, and time. No cranial nerve deficit. He exhibits normal muscle tone. Coordination normal.  Skin: Skin is warm and dry. No rash noted. He is not diaphoretic.  Psychiatric: He has a normal mood and affect. His behavior is normal.       Assessment:     2 weeks s/p Lap BIH and umb hernia repairs in morbidly obese male with prior open [facility] repair.  No pain free in side of prior surgery    Plan:     I tried to reassure him that it is typical for him to have some more soreness on the side that had prior surgery. He is obese and he had 3 hernias fixed only 2 weeks ago. I offered after numerous discussions to give a different narcotic to see if that would help.   I recommended a bowel regimen to help minimize his risk of constipation. I offered to have him switched different over-the-counter pain medications such as from ibuprofen to naproxen. However, I noted his pain was not controlled with over-the-counter medications; therefore,  using a narcotic again could be helpful to help him recover. He seemed to be finally convinced. Therefore prescribed the tramadol #30.  Otherwise increase activities as tolerated and moved to unrestricted activity over the next month.  As well as his pain goes down, he can return to clinic p.r.n. If he does not improve or worsen, then he may need to consider amitriptyline or a reevaluation. I am hopeful that he will improve since he only 2 weeks out.

## 2011-02-18 NOTE — Patient Instructions (Signed)

## 2011-02-26 ENCOUNTER — Encounter: Payer: Self-pay | Admitting: Cardiology

## 2011-02-26 ENCOUNTER — Ambulatory Visit (INDEPENDENT_AMBULATORY_CARE_PROVIDER_SITE_OTHER): Payer: Medicare Other | Admitting: Cardiology

## 2011-02-26 DIAGNOSIS — I2510 Atherosclerotic heart disease of native coronary artery without angina pectoris: Secondary | ICD-10-CM

## 2011-02-26 DIAGNOSIS — E785 Hyperlipidemia, unspecified: Secondary | ICD-10-CM

## 2011-02-26 DIAGNOSIS — I10 Essential (primary) hypertension: Secondary | ICD-10-CM

## 2011-02-26 NOTE — Progress Notes (Signed)
[id number] male presents for fu of CAD. He has a history of CAD, status post PCI to the RCA in 2002. He presented in 10/11 with an NSTEMI. Cardiac catheterization 02/01/10: Proximal LAD 30%, mid LAD 30-40%, proximal circumflex 20%, mid circumflex 60%, distal circumflex occluded, proximal RCA 20%, distal RCA 95%, RCA stent patent, mid to distal PDA 90-95% in multiple areas, proximal PL2 80%, EF 55%. His distal circumflex and PDA were both treated medically. He underwent bare-metal stent placement to the distal RCA and drug-eluting stent placement to the PL2. He was last seen by Dr. Jens Som in 11/11. Recently seen and cleared for hernia surgery. Since he was last seen, the patient denies any dyspnea on exertion, orthopnea, PND, pedal edema, palpitations, syncope or chest pain.   Current Outpatient Prescriptions  Medication Sig Dispense Refill  . aspirin 325 MG tablet Take 325 mg by mouth daily.        . hydrochlorothiazide (HYDRODIURIL) 25 MG tablet Take 1 tablet (25 mg total) by mouth daily.  30 tablet  6  . metoprolol (LOPRESSOR) 50 MG tablet Take 1 tablet (50 mg total) by mouth 2 (two) times daily.  60 tablet  4  . traMADol (ULTRAM) 50 MG tablet Take 1-2 tablets (50-100 mg total) by mouth every 6 (six) hours as needed for pain. Maximum dose= 8 tablets per day  30 tablet  1     Past Medical History  Diagnosis Date  . CAD (coronary artery disease)     a. s/p PCI to RCA 2002;  b. NSTEMI 10/11: cath with pLAD 30%, mLAD 30-40%, pCFX 20%, mCFX 60%, dCFX occluded (med Rx); pRCA 20%, dRCA 95% before stent tx with BMS, RCA stent ok, m-d PDA 90-95% (med Rx); pPL2 80% tx with DES; EF 55%  . Hyperlipidemia     intolerant to statins  . Hypertension   . TIA (transient ischemic attack) 2002  . Glucose intolerance (impaired glucose tolerance)     Past Surgical History  Procedure Date  . Hernia repair 2007    LIH  . Hernia repair 02/04/11    BIH repair     History   Social History  . Marital  Status: Married    Spouse Name: N/A    Number of Children: N/A  . Years of Education: N/A   Occupational History  . Not on file.   Social History Main Topics  . Smoking status: Never Smoker   . Smokeless tobacco: Never Used  . Alcohol Use: No  . Drug Use: No  . Sexually Active:    Other Topics Concern  . Not on file   Social History Narrative  . No narrative on file    ROS: residual pain from recent hernia surgery but no fevers or chills, productive cough, hemoptysis, dysphasia, odynophagia, melena, hematochezia, dysuria, hematuria, rash, seizure activity, orthopnea, PND, pedal edema, claudication. Remaining systems are negative.  Physical Exam: Well-developed well-nourished in no acute distress.  Skin is warm and dry.  HEENT is normal.  Neck is supple. No thyromegaly.  Chest is clear to auscultation with normal expansion.  Cardiovascular exam is regular rate and rhythm.  Abdominal exam nontender or distended. No masses palpated. Extremities show no edema. neuro grossly intact

## 2011-02-26 NOTE — Assessment & Plan Note (Signed)
Intolerant to statins. Continue diet. 

## 2011-02-26 NOTE — Patient Instructions (Signed)
Your physician wants you to follow-up in: 1 year with Dr. Crenshaw. You will receive a reminder letter in the mail two months in advance. If you don't receive a letter, please call our office to schedule the follow-up appointment.  Your physician recommends that you continue on your current medications as directed. Please refer to the Current Medication list given to you today.  

## 2011-02-26 NOTE — Assessment & Plan Note (Signed)
Continue present blood pressure medications. Monitor blood pressure at home and increase medications as needed.

## 2011-02-26 NOTE — Assessment & Plan Note (Signed)
Continue aspirin and beta blocker. Intolerant to statins.

## 2011-08-08 ENCOUNTER — Other Ambulatory Visit: Payer: Self-pay | Admitting: Cardiology

## 2011-08-08 ENCOUNTER — Other Ambulatory Visit: Payer: Self-pay

## 2011-08-08 MED ORDER — HYDROCHLOROTHIAZIDE 25 MG PO TABS: 25.0000 mg | ORAL_TABLET | Freq: Every day | ORAL | Status: DC

## 2011-08-08 NOTE — Telephone Encounter (Signed)
..   Requested Prescriptions   Signed Prescriptions Disp Refills  . hydrochlorothiazide (HYDRODIURIL) 25 MG tablet 30 tablet 7    Sig: Take 1 tablet (25 mg total) by mouth daily.    Authorizing Provider: Lewayne Bunting    Ordering User: Christella Hartigan, Olsen Mccutchan Judie Petit

## 2011-10-23 ENCOUNTER — Other Ambulatory Visit: Payer: Self-pay | Admitting: *Deleted

## 2011-10-23 MED ORDER — METOPROLOL TARTRATE 50 MG PO TABS: 50.0000 mg | ORAL_TABLET | Freq: Two times a day (BID) | ORAL | Status: DC

## 2011-12-17 ENCOUNTER — Telehealth: Payer: Self-pay | Admitting: Cardiology

## 2011-12-17 MED ORDER — NITROGLYCERIN 0.4 MG SL SUBL: 0.4000 mg | SUBLINGUAL_TABLET | SUBLINGUAL | Status: DC | PRN

## 2011-12-17 NOTE — Telephone Encounter (Signed)
Pt lost rx for nitro, can get called in? uses [facility] @ [phone]

## 2012-01-24 ENCOUNTER — Telehealth: Payer: Self-pay | Admitting: Cardiology

## 2012-01-24 MED ORDER — METOPROLOL TARTRATE 50 MG PO TABS: 50.0000 mg | ORAL_TABLET | Freq: Two times a day (BID) | ORAL | Status: DC

## 2012-01-24 NOTE — Telephone Encounter (Signed)
metoprolol 50 mg, pt out needs asap today, would like 1 year refills  [facility] , pls call when done [phone]

## 2012-02-18 ENCOUNTER — Telehealth: Payer: Self-pay | Admitting: Internal Medicine

## 2012-02-18 NOTE — Telephone Encounter (Signed)
pt wanted to schedule colon, requested chart to verify md, former pt of Dr. Corinda Gubler, was assigned with Dr. Leone Payor.  He will call back for January schedule to get an AM appt.

## 2012-02-25 ENCOUNTER — Encounter: Payer: Self-pay | Admitting: Cardiology

## 2012-02-25 ENCOUNTER — Ambulatory Visit (INDEPENDENT_AMBULATORY_CARE_PROVIDER_SITE_OTHER): Payer: Medicare Other | Admitting: Cardiology

## 2012-02-25 VITALS — BP 122/64 | HR 56 | Ht 72.0 in | Wt 264.0 lb

## 2012-02-25 DIAGNOSIS — R0989 Other specified symptoms and signs involving the circulatory and respiratory systems: Secondary | ICD-10-CM

## 2012-02-25 DIAGNOSIS — I2510 Atherosclerotic heart disease of native coronary artery without angina pectoris: Secondary | ICD-10-CM

## 2012-02-25 DIAGNOSIS — I10 Essential (primary) hypertension: Secondary | ICD-10-CM

## 2012-02-25 MED ORDER — AMLODIPINE BESYLATE 5 MG PO TABS: 5.0000 mg | ORAL_TABLET | Freq: Every day | ORAL | Status: DC

## 2012-02-25 NOTE — Assessment & Plan Note (Signed)
Continue diet. Intolerant to statins. 

## 2012-02-25 NOTE — Assessment & Plan Note (Signed)
Patient having problems with impotence and feels it may be related to metoprolol. Plan decrease to 25 mg by mouth twice a day for 3 days and then discontinue. Add amlodipine 5 mg daily. Follow blood pressure and increase as needed.

## 2012-02-25 NOTE — Progress Notes (Signed)
   HPI: Pleasant male presents for fu of CAD. He has a history of CAD, status post PCI to the RCA in 2002. He presented in 10/11 with an NSTEMI. Cardiac catheterization 02/01/10: Proximal LAD 30%, mid LAD 30-40%, proximal circumflex 20%, mid circumflex 60%, distal circumflex occluded, proximal RCA 20%, distal RCA 95%, RCA stent patent, mid to distal PDA 90-95% in multiple areas, proximal PL2 80%, EF 55%. His distal circumflex and PDA were both treated medically. He underwent bare-metal stent placement to the distal RCA and drug-eluting stent placement to the PL2. I last saw him in Nov 2012. Since then, the patient has dyspnea with more extreme activities but not with routine activities. It is relieved with rest. It is not associated with chest pain. There is no orthopnea, PND or pedal edema. There is no syncope or palpitations. There is no exertional chest pain.    Current Outpatient Prescriptions  Medication Sig Dispense Refill  . aspirin 325 MG tablet Take 325 mg by mouth daily.        . hydrochlorothiazide (HYDRODIURIL) 25 MG tablet Take 1 tablet (25 mg total) by mouth daily.  30 tablet  8  . metoprolol (LOPRESSOR) 50 MG tablet Take 1 tablet (50 mg total) by mouth 2 (two) times daily.  60 tablet  4  . nitroGLYCERIN (NITROSTAT) 0.4 MG SL tablet Place 1 tablet (0.4 mg total) under the tongue every 5 (five) minutes as needed.  25 tablet  12     Past Medical History  Diagnosis Date  . CAD (coronary artery disease)     a. s/p PCI to RCA 2002;  b. NSTEMI 10/11: cath with pLAD 30%, mLAD 30-40%, pCFX 20%, mCFX 60%, dCFX occluded (med Rx); pRCA 20%, dRCA 95% before stent tx with BMS, RCA stent ok, m-d PDA 90-95% (med Rx); pPL2 80% tx with DES; EF 55%  . Hyperlipidemia     intolerant to statins  . Hypertension   . TIA (transient ischemic attack) 2002  . Glucose intolerance (impaired glucose tolerance)     Past Surgical History  Procedure Date  . Hernia repair 2007    LIH  . Hernia repair  02/04/11    BIH repair     History   Social History  . Marital Status: Married    Spouse Name: N/A    Number of Children: N/A  . Years of Education: N/A   Occupational History  . Not on file.   Social History Main Topics  . Smoking status: Never Smoker   . Smokeless tobacco: Never Used  . Alcohol Use: No  . Drug Use: No  . Sexually Active:    Other Topics Concern  . Not on file   Social History Narrative  . No narrative on file    ROS: no fevers or chills, productive cough, hemoptysis, dysphasia, odynophagia, melena, hematochezia, dysuria, hematuria, rash, seizure activity, orthopnea, PND, pedal edema, claudication. Remaining systems are negative.  Physical Exam: Well-developed well-nourished in no acute distress.  Skin is warm and dry.  HEENT is normal.  Neck is supple.  Chest is clear to auscultation with normal expansion.  Cardiovascular exam is regular rate and rhythm.  Abdominal exam nontender or distended. No masses palpated. Bruit noted Extremities show no edema. neuro grossly intact  ECG sinus rhythm at a rate of 56. First degree AV block. Prior inferior infarct.

## 2012-02-25 NOTE — Assessment & Plan Note (Signed)
Schedule abdominal ultrasound to exclude aneurysm. 

## 2012-02-25 NOTE — Assessment & Plan Note (Signed)
Continue aspirin. Intolerant to statins. Plan Myoview when he returns in one year.

## 2012-02-25 NOTE — Patient Instructions (Addendum)
Your physician wants you to follow-up in: ONE YEAR WITH DR Shelda Pal will receive a reminder letter in the mail two months in advance. If you don't receive a letter, please call our office to schedule the follow-up appointment.   DECREASE METOPROLOL TO 25 MG TWICE DAILY X 3 DAYS THEN STOP  THE DAY AFTER STOPPING METOPROLOL START AMLODIPINE 5 MG ONCE DAILY  Your physician has requested that you have an abdominal aorta duplex. During this test, an ultrasound is used to evaluate the aorta. Allow 30 minutes for this exam. Do not eat after midnight the day before and avoid carbonated beverages

## 2012-03-08 HISTORY — PX: CORONARY ANGIOPLASTY: SHX604

## 2012-03-23 ENCOUNTER — Encounter (INDEPENDENT_AMBULATORY_CARE_PROVIDER_SITE_OTHER): Payer: Medicare Other

## 2012-03-23 DIAGNOSIS — R0989 Other specified symptoms and signs involving the circulatory and respiratory systems: Secondary | ICD-10-CM

## 2012-04-03 ENCOUNTER — Emergency Department (HOSPITAL_COMMUNITY): Payer: Medicare Other

## 2012-04-03 ENCOUNTER — Encounter (HOSPITAL_COMMUNITY): Payer: Self-pay | Admitting: Emergency Medicine

## 2012-04-03 ENCOUNTER — Inpatient Hospital Stay (HOSPITAL_COMMUNITY)
Admission: EM | Admit: 2012-04-03 | Discharge: 2012-04-07 | DRG: 247 | Disposition: A | Discharge status: Home / Self Care | Payer: Medicare Other | Attending: Cardiology | Admitting: Cardiology

## 2012-04-03 DIAGNOSIS — R739 Hyperglycemia, unspecified: Secondary | ICD-10-CM

## 2012-04-03 DIAGNOSIS — Z79899 Other long term (current) drug therapy: Secondary | ICD-10-CM

## 2012-04-03 DIAGNOSIS — E785 Hyperlipidemia, unspecified: Secondary | ICD-10-CM | POA: Diagnosis present

## 2012-04-03 DIAGNOSIS — I200 Unstable angina: Secondary | ICD-10-CM

## 2012-04-03 DIAGNOSIS — I214 Non-ST elevation (NSTEMI) myocardial infarction: Secondary | ICD-10-CM

## 2012-04-03 DIAGNOSIS — IMO0001 Reserved for inherently not codable concepts without codable children: Secondary | ICD-10-CM | POA: Diagnosis present

## 2012-04-03 DIAGNOSIS — Z7982 Long term (current) use of aspirin: Secondary | ICD-10-CM

## 2012-04-03 DIAGNOSIS — T82897A Other specified complication of cardiac prosthetic devices, implants and grafts, initial encounter: Principal | ICD-10-CM | POA: Diagnosis present

## 2012-04-03 DIAGNOSIS — Z8249 Family history of ischemic heart disease and other diseases of the circulatory system: Secondary | ICD-10-CM

## 2012-04-03 DIAGNOSIS — E669 Obesity, unspecified: Secondary | ICD-10-CM | POA: Diagnosis present

## 2012-04-03 DIAGNOSIS — E119 Type 2 diabetes mellitus without complications: Secondary | ICD-10-CM

## 2012-04-03 DIAGNOSIS — Z6834 Body mass index (BMI) 34.0-34.9, adult: Secondary | ICD-10-CM

## 2012-04-03 DIAGNOSIS — Z8673 Personal history of transient ischemic attack (TIA), and cerebral infarction without residual deficits: Secondary | ICD-10-CM

## 2012-04-03 DIAGNOSIS — I10 Essential (primary) hypertension: Secondary | ICD-10-CM | POA: Diagnosis present

## 2012-04-03 DIAGNOSIS — D696 Thrombocytopenia, unspecified: Secondary | ICD-10-CM

## 2012-04-03 DIAGNOSIS — Z888 Allergy status to other drugs, medicaments and biological substances status: Secondary | ICD-10-CM

## 2012-04-03 DIAGNOSIS — Y840 Cardiac catheterization as the cause of abnormal reaction of the patient, or of later complication, without mention of misadventure at the time of the procedure: Secondary | ICD-10-CM | POA: Diagnosis present

## 2012-04-03 DIAGNOSIS — I252 Old myocardial infarction: Secondary | ICD-10-CM

## 2012-04-03 DIAGNOSIS — E876 Hypokalemia: Secondary | ICD-10-CM

## 2012-04-03 DIAGNOSIS — I2510 Atherosclerotic heart disease of native coronary artery without angina pectoris: Secondary | ICD-10-CM | POA: Diagnosis present

## 2012-04-03 DIAGNOSIS — Y92009 Unspecified place in unspecified non-institutional (private) residence as the place of occurrence of the external cause: Secondary | ICD-10-CM

## 2012-04-03 DIAGNOSIS — Z955 Presence of coronary angioplasty implant and graft: Secondary | ICD-10-CM

## 2012-04-03 DIAGNOSIS — Z7902 Long term (current) use of antithrombotics/antiplatelets: Secondary | ICD-10-CM

## 2012-04-03 DIAGNOSIS — E782 Mixed hyperlipidemia: Secondary | ICD-10-CM | POA: Diagnosis present

## 2012-04-03 DIAGNOSIS — I440 Atrioventricular block, first degree: Secondary | ICD-10-CM | POA: Diagnosis present

## 2012-04-03 DIAGNOSIS — Z9861 Coronary angioplasty status: Secondary | ICD-10-CM

## 2012-04-03 HISTORY — DX: Type 2 diabetes mellitus without complications: E11.9

## 2012-04-03 LAB — CBC WITH DIFFERENTIAL/PLATELET
Basophils Absolute: 0 10*3/uL (ref 0.0–0.1)
Basophils Relative: 1 % (ref 0–1)
Eosinophils Absolute: 0.1 10*3/uL (ref 0.0–0.7)
Eosinophils Relative: 2 % (ref 0–5)
HCT: 44.3 % (ref 39.0–52.0)
Hemoglobin: 14.9 g/dL (ref 13.0–17.0)
Lymphocytes Relative: 20 % (ref 12–46)
Lymphs Abs: 0.8 10*3/uL (ref 0.7–4.0)
MCH: 29.1 pg (ref 26.0–34.0)
MCHC: 33.6 g/dL (ref 30.0–36.0)
MCV: 86.5 fL (ref 78.0–100.0)
Monocytes Absolute: 0.3 10*3/uL (ref 0.1–1.0)
Monocytes Relative: 8 % (ref 3–12)
Neutro Abs: 2.8 10*3/uL (ref 1.7–7.7)
Neutrophils Relative %: 70 % (ref 43–77)
Platelets: 157 10*3/uL (ref 150–400)
RBC: 5.12 MIL/uL (ref 4.22–5.81)
RDW: 12.9 % (ref 11.5–15.5)
WBC: 4.1 10*3/uL (ref 4.0–10.5)

## 2012-04-03 LAB — BASIC METABOLIC PANEL
BUN: 24 mg/dL — ABNORMAL HIGH (ref 6–23)
CO2: 24 mEq/L (ref 19–32)
Calcium: 9.4 mg/dL (ref 8.4–10.5)
Chloride: 96 mEq/L (ref 96–112)
Chloride: 96 mEq/L (ref 96–112)
Creatinine, Ser: 1.05 mg/dL (ref 0.50–1.35)
GFR calc Af Amer: 83 mL/min — ABNORMAL LOW (ref >90)
GFR calc Af Amer: 84 mL/min — ABNORMAL LOW (ref >90)
GFR calc non Af Amer: 71 mL/min — ABNORMAL LOW (ref >90)
GFR calc non Af Amer: 72 mL/min — ABNORMAL LOW (ref >90)
Glucose, Bld: 352 mg/dL — ABNORMAL HIGH (ref 70–99)
Potassium: 3.7 mEq/L (ref 3.5–5.1)
Potassium: 3.5 mEq/L (ref 3.5–5.1)
Sodium: 134 mEq/L — ABNORMAL LOW (ref 135–145)
Sodium: 135 mEq/L (ref 135–145)

## 2012-04-03 LAB — POCT I-STAT TROPONIN I: Troponin i, poc: 0.1 ng/mL (ref 0.00–0.08)

## 2012-04-03 LAB — PROTIME-INR
INR: 0.87 (ref 0.00–1.49)
Prothrombin Time: 11.8 seconds (ref 11.6–15.2)

## 2012-04-03 LAB — CBC
MCHC: 33.3 g/dL (ref 30.0–36.0)
RDW: 12.9 % (ref 11.5–15.5)
WBC: 4 10*3/uL (ref 4.0–10.5)

## 2012-04-03 LAB — TROPONIN I: Troponin I: <0.3 ng/mL (ref <0.30)

## 2012-04-03 MED ORDER — SODIUM CHLORIDE 0.9 % IV SOLN
250.0000 mL | INTRAVENOUS | Status: DC | PRN
  Administered 2012-04-04: 250 mL via INTRAVENOUS

## 2012-04-03 MED ORDER — ALPRAZOLAM 0.25 MG PO TABS: 0.2500 mg | ORAL_TABLET | Freq: Two times a day (BID) | ORAL | Status: DC | PRN

## 2012-04-03 MED ORDER — ACETAMINOPHEN 325 MG PO TABS
650.0000 mg | ORAL_TABLET | ORAL | Status: DC | PRN
  Administered 2012-04-04: 650 mg via ORAL
  Filled 2012-04-03: qty 2

## 2012-04-03 MED ORDER — SODIUM CHLORIDE 0.9 % IJ SOLN: 3.0000 mL | INTRAMUSCULAR | Status: DC | PRN

## 2012-04-03 MED ORDER — HEPARIN (PORCINE) IN NACL 100-0.45 UNIT/ML-% IJ SOLN
2300.0000 [IU]/h | INTRAMUSCULAR | Status: DC
  Administered 2012-04-03: 1400 [IU]/h via INTRAVENOUS
  Administered 2012-04-04: 1800 [IU]/h via INTRAVENOUS
  Administered 2012-04-04: 2100 [IU]/h via INTRAVENOUS
  Administered 2012-04-05 – 2012-04-06 (×3): 2300 [IU]/h via INTRAVENOUS
  Filled 2012-04-03 (×9): qty 250

## 2012-04-03 MED ORDER — HEPARIN BOLUS VIA INFUSION
4000.0000 [IU] | Freq: Once | INTRAVENOUS | Status: AC
  Administered 2012-04-03: 4000 [IU] via INTRAVENOUS

## 2012-04-03 MED ORDER — ZOLPIDEM TARTRATE 5 MG PO TABS: 5.0000 mg | ORAL_TABLET | Freq: Every evening | ORAL | Status: DC | PRN

## 2012-04-03 MED ORDER — ONDANSETRON HCL 4 MG/2ML IJ SOLN: 4.0000 mg | Freq: Four times a day (QID) | INTRAMUSCULAR | Status: DC | PRN

## 2012-04-03 MED ORDER — SODIUM CHLORIDE 0.9 % IJ SOLN
3.0000 mL | Freq: Two times a day (BID) | INTRAMUSCULAR | Status: DC
  Administered 2012-04-06: 21:00:00 3 mL via INTRAVENOUS

## 2012-04-03 MED ORDER — ASPIRIN 81 MG PO CHEW
324.0000 mg | CHEWABLE_TABLET | Freq: Once | ORAL | Status: AC
  Administered 2012-04-03: 324 mg via ORAL
  Filled 2012-04-03: qty 4

## 2012-04-03 MED ORDER — NITROGLYCERIN 0.4 MG SL SUBL: 0.4000 mg | SUBLINGUAL_TABLET | SUBLINGUAL | Status: DC | PRN

## 2012-04-03 NOTE — ED Notes (Signed)
Report given to floor pt transported with monitor.

## 2012-04-03 NOTE — ED Notes (Signed)
Denies ANY pain at present.

## 2012-04-03 NOTE — ED Notes (Signed)
Pt c/o mid sternal CP intermittently x 5 days; pt sts exertional SOB; pt denies N/V or diaphoresis

## 2012-04-03 NOTE — ED Provider Notes (Signed)
History     CSN: [id number]  Arrival date & time 04/03/12  1250   First MD Initiated Contact with Patient 04/03/12 1340      Chief Complaint  Patient presents with  . Chest Pain    (Consider location/radiation/quality/duration/timing/severity/associated sxs/prior treatment) HPI Pt presents with c/o chest pain.  He states he has had pain intermittently over the past 5 days, he states on the first day the chest pain began at rest.  Has been off and on for the past few days.  Today he states he had chest pain and shortness of breath associated with exertion.  This resolved on its own with rest.  No chest pain now.  No fever/cough, no leg swelling or fever.  Has hx of 3 cardiac stents.  Last took nitro 5 days ago.  Pain located in midsternal region, no radiation, no nausea or diaphoresis.  There are no other associated systemic symptoms, there are no other alleviating or modifying factors.   Past Medical History  Diagnosis Date  . CAD (coronary artery disease)     a. s/p PCI to RCA 2002;  b. NSTEMI 10/11: cath with pLAD 30%, mLAD 30-40%, pCFX 20%, mCFX 60%, dCFX occluded (med Rx); pRCA 20%, dRCA 95% before stent tx with BMS, RCA stent ok, m-d PDA 90-95% (med Rx); pPL2 80% tx with DES; EF 55%  . Hyperlipidemia     intolerant to statins  . Hypertension   . TIA (transient ischemic attack) 2002  . Glucose intolerance (impaired glucose tolerance)     Past Surgical History  Procedure Date  . Hernia repair 2007    LIH  . Hernia repair 02/04/11    BIH repair   . Coronary angioplasty with stent placement 2011    Dist RCA 4.0 x 15 mm BMS, PL 2.5 x 12 mm ION DES    Family History  Problem Relation Age of Onset  . Heart disease Mother     History  Substance Use Topics  . Smoking status: Never Smoker   . Smokeless tobacco: Never Used  . Alcohol Use: No      Review of Systems ROS reviewed and all otherwise negative except for mentioned in HPI  Allergies  Review of patient's  allergies indicates no known allergies.  Home Medications   Current Outpatient Rx  Name  Route  Sig  Dispense  Refill  . AMLODIPINE BESYLATE 5 MG PO TABS   Oral   Take 1 tablet (5 mg total) by mouth daily.   90 tablet   4   . ASPIRIN 325 MG PO TABS   Oral   Take 325 mg by mouth daily.           Marland Kitchen HYDROCHLOROTHIAZIDE 25 MG PO TABS   Oral   Take 1 tablet (25 mg total) by mouth daily.   30 tablet   8     90 day supply is acceptable   . NITROGLYCERIN 0.4 MG SL SUBL   Sublingual   Place 1 tablet (0.4 mg total) under the tongue every 5 (five) minutes as needed.   25 tablet   12     BP 157/99  Pulse 107  Temp 98.2 F (36.8 C) (Oral)  Resp 18  SpO2 94% Vitals reviewed Physical Exam Physical Examination: General appearance - alert, well appearing, and in no distress Mental status - alert, oriented to person, place, and time Eyes - no conjunctival injection or scleral icterus Mouth - mucous membranes moist, pharynx  normal without lesions Chest - clear to auscultation, no wheezes, rales or rhonchi, symmetric air entry Heart - normal rate, regular rhythm, normal S1, S2, no murmurs, rubs, clicks or gallops Abdomen - soft, nontender, nondistended, no masses or organomegaly Extremities - peripheral pulses normal, no pedal edema, no clubbing or cyanosis Skin - normal coloration and turgor, no rashes  ED Course  Procedures (including critical care time)  4:39 PM  D/w cardiology, [facility]- they will see patient in the ED   Date: 04/03/2012  Rate: 105  Rhythm: sinus tachycardia with PACs  QRS Axis: normal  Intervals: normal  ST/T Wave abnormalities: nonspecific ST/T changes  Conduction Disutrbances:none  Narrative Interpretation:   Old EKG Reviewed: unchanged  CRITICAL CARE Performed by: Ethelda Chick   Total critical care time: 40  Critical care time was exclusive of separately billable procedures and treating other patients.  Critical care was necessary to treat  or prevent imminent or life-threatening deterioration.  Critical care was time spent personally by me on the following activities: development of treatment plan with patient and/or surrogate as well as nursing, discussions with consultants, evaluation of patient's response to treatment, examination of patient, obtaining history from patient or surrogate, ordering and performing treatments and interventions, ordering and review of laboratory studies, ordering and review of radiographic studies, pulse oximetry and re-evaluation of patient's condition.   Labs Reviewed  BASIC METABOLIC PANEL - Abnormal; Notable for the following:    Sodium 134 (*)     Glucose, Bld 352 (*)     BUN 24 (*)     GFR calc non Af Amer 71 (*)     GFR calc Af Amer 83 (*)     All other components within normal limits  POCT I-STAT TROPONIN I - Abnormal; Notable for the following:    Troponin i, poc 0.09 (*)     All other components within normal limits  CBC - Abnormal; Notable for the following:    Platelets 140 (*)     All other components within normal limits  BASIC METABOLIC PANEL - Abnormal; Notable for the following:    Glucose, Bld 282 (*)     GFR calc non Af Amer 72 (*)     GFR calc Af Amer 84 (*)     All other components within normal limits  POCT I-STAT TROPONIN I - Abnormal; Notable for the following:    Troponin i, poc 0.10 (*)     All other components within normal limits  CBC WITH DIFFERENTIAL  PROTIME-INR   Dg Chest 2 View  04/03/2012  *RADIOLOGY REPORT*  Clinical Data: Chest pain, shortness of breath.  CHEST - 2 VIEW  Comparison: 01/30/2010  Findings: Prominent spurring about both acromioclavicular joints. Flowing osteophytes at multiple contiguous levels in the mid thoracic spine. Lungs clear.  Heart size and pulmonary vascularity normal.  No effusion.  IMPRESSION: No acute disease   Original Report Authenticated By: D. Andria Rhein, MD      1. NSTEMI (non-ST elevated myocardial infarction)        MDM  Pt presenting with c/o chest pain intermittently over the past several days.  Today has no changes on his EKG but does have elevation in troponin of 0.09 and 0.1.  Pt chest pain free on my evaluation.  Started on heparin for nstemi/acs.  D/w [facility] cardiology who will see patient in the ED for admission.          Ethelda Chick, MD 04/03/12 [phone]

## 2012-04-03 NOTE — Progress Notes (Signed)
ANTICOAGULATION CONSULT NOTE - Initial Consult  Pharmacy Consult for heparin Indication: chest pain/ACS  No Known Allergies  Patient Measurements: Ht: 6'' Wt: 120kg IBW= 73kg Heparin dosing weight= 100kg  Vital Signs: Temp: 98.2 F (36.8 C) (12/27 1251) Temp src: Oral (12/27 1251) BP: 157/99 mmHg (12/27 1251) Pulse Rate: 107  (12/27 1251)  Labs:  Basename 04/03/12 1525 04/03/12 1256  HGB 14.7 14.9  HCT 44.2 44.3  PLT 140* 157  APTT -- --  LABPROT -- 11.8  INR -- 0.87  HEPARINUNFRC -- --  CREATININE 1.04 1.05  CKTOTAL -- --  CKMB -- --  TROPONINI -- --     Medical History: Past Medical History  Diagnosis Date  . CAD (coronary artery disease)     a. s/p PCI to RCA 2002;  b. NSTEMI 10/11: cath with pLAD 30%, mLAD 30-40%, pCFX 20%, mCFX 60%, dCFX occluded (med Rx); pRCA 20%, dRCA 95% before stent tx with BMS, RCA stent ok, m-d PDA 90-95% (med Rx); pPL2 80% tx with DES; EF 55%  . Hyperlipidemia     intolerant to statins  . Hypertension   . TIA (transient ischemic attack) 2002  . Glucose intolerance (impaired glucose tolerance)     Assessment: 66 yo male here with CP to start on heparin.  Goal of Therapy:  Heparin level 0.3-0.7 units/ml Monitor platelets by anticoagulation protocol: Yes   Plan:  -Start heparin with bolus of 4000 units IV followed by 1400 units/hr (14 units/kg/hr) -Heparin level in 6 hours and daily wth CBC daily  Harland German, Pharm D 04/03/2012 5:09 PM

## 2012-04-03 NOTE — ED Notes (Signed)
Troponin results shown to charge nurse and [employer]

## 2012-04-03 NOTE — H&P (Signed)
History and Physical   Patient ID: [name]. MRN: [id number], DOB/AGE: 1946-01-22 66 y.o. Date of Encounter: 04/03/2012  Primary Physician: Provider Not In System Primary Cardiologist: [facility] - last saw 02/25/2012  Chief Complaint:   Chest pain  HPI: [name] is a 66 year old male with a history of CAD. He has not had chest pain or DOE since PCI in 2011. About 2 months ago, he began having DOE.  This has increased in severity and he now has to stop regularly when carrying heavy objects or exerting himself at work. Last Sunday, he developed SSCP after lunch, it reached 6-8/10. It started without exertion and radiated to his left arm. He took several NTG SL without relief and finally took Rolaids which helped. He does not routinely get this pain. Since then, he has had 3-4 episodes of SSCP, associated with exertion, not associated with meals. They are accompanied by SOB, but no N&V or diaphoresis. They have all resolved with rest. Today he was doing yard work and got SOB with minimal exertion. He came to the ER and POC Troponin was elevated x 2. He is currently pain-free. His symptoms this week have reminded him of his symptoms prior to his last PCI.  Past Medical History  Diagnosis Date  . CAD (coronary artery disease)     a. s/p PCI to RCA 2002;  b. NSTEMI 10/11: cath with pLAD 30%, mLAD 30-40%, pCFX 20%, mCFX 60%, dCFX occluded (med Rx); pRCA 20%, dRCA 95% before stent tx with BMS, RCA stent ok, m-d PDA 90-95% (med Rx); pPL2 80% tx with DES; EF 55%  . Hyperlipidemia     intolerant to statins  . Hypertension   . TIA (transient ischemic attack) 2002  . Glucose intolerance (impaired glucose tolerance)      Surgical History:  Past Surgical History  Procedure Date  . Hernia repair 2007    [facility]  . Hernia repair 02/04/11    BIH repair   . Coronary angioplasty with stent placement 2011    Dist RCA 4.0 x 15 mm BMS, PL 2.5 x 12 mm ION DES     I have reviewed the patient's current  medications. Prior to Admission medications   Medication Sig Start Date End Date Taking? Authorizing Provider  amLODipine (NORVASC) 5 MG tablet Take 1 tablet (5 mg total) by mouth daily. 02/25/12 02/24/13 Yes Lewayne Bunting, MD  aspirin 325 MG tablet Take 325 mg by mouth daily.     Yes Historical Provider, MD  hydrochlorothiazide (HYDRODIURIL) 25 MG tablet Take 1 tablet (25 mg total) by mouth daily. 08/08/11 08/07/12 Yes Lewayne Bunting, MD  nitroGLYCERIN (NITROSTAT) 0.4 MG SL tablet Place 1 tablet (0.4 mg total) under the tongue every 5 (five) minutes as needed. 12/17/11  Yes Lewayne Bunting, MD   Allergies: No Known Allergies  History   Social History  . Marital Status: Married    Spouse Name: N/A    Number of Children: N/A  . Years of Education: N/A   Occupational History  . Not on file.   Social History Main Topics  . Smoking status: Never Smoker   . Smokeless tobacco: Never Used  . Alcohol Use: No  . Drug Use: No  . Sexually Active:    Other Topics Concern  . Not on file   Social History Narrative  . No narrative on file    Family History  Problem Relation Age of Onset  . Heart  disease Mother    Family Status  Relation Status Death Age  . Mother Deceased   . Father Deceased     Review of Systems: He denies melena. He does not often have indigestion. He gets occasional MS aches and pains.No recent illnesses, fevers or chills. Full 14-point review of systems otherwise negative except as noted above.   Physical Exam: Blood pressure 157/99, pulse 107, temperature 98.2 F (36.8 C), temperature source Oral, resp. rate 18, SpO2 94.00%. General: Well developed, well nourished, male in no acute distress. Head: Normocephalic, atraumatic, sclera non-icteric, no xanthomas, nares are without discharge. Dentition: poor Neck: No carotid bruits. JVD not elevated. No thyromegally Lungs: Good expansion bilaterally. without wheezes or rhonchi. Slightly decreased breath sounds  bases. Heart: Regular rate and rhythm with S1 S2.  No S3 or S4.  No murmur, no rubs, or gallops appreciated. Abdomen: Soft, non-tender, non-distended with normoactive bowel sounds. No hepatomegaly. No rebound/guarding. No obvious abdominal masses. Msk:  Strength and tone appear normal for age. No joint deformities or effusions, no spine or costo-vertebral angle tenderness. Extremities: No clubbing or cyanosis. No edema.  Distal pedal pulses are 2+ in 4 extrem Neuro: Alert and oriented X 3. Moves all extremities spontaneously. No focal deficits noted. Psych:  Responds to questions appropriately with a normal affect. Skin: No rashes or lesions noted  Labs:   Lab Results  Component Value Date   WBC 4.0 04/03/2012   HGB 14.7 04/03/2012   HCT 44.2 04/03/2012   MCV 86.3 04/03/2012   PLT 140* 04/03/2012    Basename 04/03/12 1256  INR 0.87     Lab 04/03/12 1525  NA 135  K 3.5  CL 96  CO2 25  BUN 23  CREATININE 1.04  CALCIUM 9.2  PROT --  BILITOT --  ALKPHOS --  ALT --  AST --  GLUCOSE 282*   No results found for this basename: CKTOTAL:4,CKMB:4,TROPONINI:4 in the last 72 hours  Basename 04/03/12 1533 04/03/12 1325  TROPIPOC 0.10* 0.09*   Lab Results  Component Value Date   CHOL 165 10/07/2007   HDL 23.8* 10/07/2007   TRIG 409* 10/07/2007   Radiology/Studies:  Dg Chest 2 View 04/03/2012  *RADIOLOGY REPORT*  Clinical Data: Chest pain, shortness of breath.  CHEST - 2 VIEW  Comparison: 01/30/2010  Findings: Prominent spurring about both acromioclavicular joints. Flowing osteophytes at multiple contiguous levels in the mid thoracic spine. Lungs clear.  Heart size and pulmonary vascularity normal.  No effusion.  IMPRESSION: No acute disease   Original Report Authenticated By: D. Andria Rhein, MD      Cardiac Cath:02/01/10: Proximal LAD 30%, mid LAD 30-40%, proximal circumflex 20%, mid circumflex 60%, distal circumflex occluded, proximal RCA 20%, distal RCA 95%, RCA stent patent, mid  to distal PDA 90-95% in multiple areas, proximal PL2 80%, EF 55%. His distal circumflex and PDA were both treated medically. He underwent bare-metal stent placement to the distal RCA and drug-eluting stent placement to the PL2.   ECG: 14-Mar-2012 15:18:12 [facility]/ED ROUTINE RECORD SINUS RHYTHM ~ normal P axis, V-rate 50- 99 BORDERLINE AV CONDUCTION DELAY ~ PR >205, V-rate 91-120 CONSIDER INFERIOR INFARCT ~ Q >69mS, initial axis(240,-30) Standard 12 Lead Report ~ Unconfirmed Interpretation Borderline ECG 46mm/s 67mm/mV 150Hz  8.0.1 12SL 235 CID: [id number] Referred by: Unconfirmed Vent. rate 92 BPM PR interval 208 ms QRS duration 82 ms QT/QTc 368/455 ms P-R-T axes 45 49 8  ASSESSMENT AND PLAN:  Principal Problem:  *Angina pectoris, crescendo -  admit, r/o MI, add heparin, IV NTG if has more pain, cycle enzymes, cath Monday.  Active Problems:  DOE (dyspnea on exertion) - CXR OK, may be anginal equivalent, follow. Sats are slightly low on RA, no overload on exam.    HYPERLIPIDEMIA-MIXED - ck profile, cont Rx    HYPERTENSION, UNSPECIFIED  - add BB, follow   Hyperglycemia - ck A1C, start ADA diet.   Obesity - see above   CAD, NATIVE VESSEL - see above   Signed,  Theodore Demark PA-C 04/03/2012, 5:38 PM  Patient seen and examined, and reviewed with Ms. Raford Pitcher in detail.  The patient presents with 5-6 episodes of exertional related chest discomfort recently, and troponin is positive.  He recently stopped beta blockers and was switched to calcium blockers with diuretics due to erectile dysfunction.  These symptoms started much later after the switch and do not appear to be related.  In 2011, he had BMS to the distal RCA and a first gen DES  (ion--first gen drug/polymer with next gen stent) to the PLA.  The distal PDA was severely diseased and distal cfx involved.  He is pain free.  Exam as recorded.  No def ECG change.  Admit for enzymes with cath planned later.  Patient in  agreement.

## 2012-04-03 NOTE — ED Notes (Signed)
Troponin shown to dr linker

## 2012-04-03 NOTE — Plan of Care (Signed)
Problem: Phase I Progression Outcomes Goal: Other Phase I Outcomes/Goals Outcome: Progressing Patient with elevated venous blood glucose > 200 per admission labs. HgbA1C pending. Signs/symptoms of diabetes, diagnostic lab work and testing for diabetes discussed with patient and family at time of admission to nursing unit.

## 2012-04-04 DIAGNOSIS — R7309 Other abnormal glucose: Secondary | ICD-10-CM

## 2012-04-04 DIAGNOSIS — I2510 Atherosclerotic heart disease of native coronary artery without angina pectoris: Secondary | ICD-10-CM

## 2012-04-04 DIAGNOSIS — R072 Precordial pain: Secondary | ICD-10-CM

## 2012-04-04 DIAGNOSIS — I200 Unstable angina: Secondary | ICD-10-CM

## 2012-04-04 DIAGNOSIS — E785 Hyperlipidemia, unspecified: Secondary | ICD-10-CM

## 2012-04-04 LAB — CBC
HCT: 44 % (ref 39.0–52.0)
Hemoglobin: 15 g/dL (ref 13.0–17.0)
MCH: 29.4 pg (ref 26.0–34.0)
MCHC: 34.1 g/dL (ref 30.0–36.0)

## 2012-04-04 LAB — HEPARIN LEVEL (UNFRACTIONATED)
Heparin Unfractionated: 0.22 IU/mL — ABNORMAL LOW (ref 0.30–0.70)
Heparin Unfractionated: 0.41 IU/mL (ref 0.30–0.70)

## 2012-04-04 LAB — LIPID PANEL
Cholesterol: 181 mg/dL (ref 0–200)
HDL: 21 mg/dL — ABNORMAL LOW (ref >39)
Triglycerides: 546 mg/dL — ABNORMAL HIGH (ref <150)

## 2012-04-04 LAB — COMPREHENSIVE METABOLIC PANEL
Alkaline Phosphatase: 80 U/L (ref 39–117)
BUN: 22 mg/dL (ref 6–23)
GFR calc Af Amer: 77 mL/min — ABNORMAL LOW (ref >90)
Glucose, Bld: 236 mg/dL — ABNORMAL HIGH (ref 70–99)
Potassium: 3 mEq/L — ABNORMAL LOW (ref 3.5–5.1)
Total Bilirubin: 0.6 mg/dL (ref 0.3–1.2)
Total Protein: 6.9 g/dL (ref 6.0–8.3)

## 2012-04-04 LAB — GLUCOSE, CAPILLARY

## 2012-04-04 LAB — HEMOGLOBIN A1C: Hgb A1c MFr Bld: 10.6 % — ABNORMAL HIGH (ref <5.7)

## 2012-04-04 MED ORDER — HEPARIN BOLUS VIA INFUSION
3000.0000 [IU] | Freq: Once | INTRAVENOUS | Status: AC
  Administered 2012-04-04: 3000 [IU] via INTRAVENOUS
  Filled 2012-04-04: qty 3000

## 2012-04-04 MED ORDER — AMLODIPINE BESYLATE 5 MG PO TABS
5.0000 mg | ORAL_TABLET | Freq: Every day | ORAL | Status: DC
  Administered 2012-04-04: 5 mg via ORAL
  Filled 2012-04-04: qty 1

## 2012-04-04 MED ORDER — METOPROLOL TARTRATE 25 MG PO TABS
25.0000 mg | ORAL_TABLET | Freq: Two times a day (BID) | ORAL | Status: DC
  Administered 2012-04-04 – 2012-04-07 (×7): 25 mg via ORAL
  Filled 2012-04-04 (×10): qty 1

## 2012-04-04 MED ORDER — INSULIN ASPART 100 UNIT/ML ~~LOC~~ SOLN
0.0000 [IU] | Freq: Three times a day (TID) | SUBCUTANEOUS | Status: DC
  Administered 2012-04-04: 5 [IU] via SUBCUTANEOUS
  Administered 2012-04-04: 11 [IU] via SUBCUTANEOUS
  Administered 2012-04-05: 5 [IU] via SUBCUTANEOUS
  Administered 2012-04-05: 3 [IU] via SUBCUTANEOUS
  Administered 2012-04-05: 5 [IU] via SUBCUTANEOUS
  Administered 2012-04-06: 18:00:00 8 [IU] via SUBCUTANEOUS
  Administered 2012-04-07: 09:00:00 3 [IU] via SUBCUTANEOUS

## 2012-04-04 MED ORDER — ASPIRIN 81 MG PO CHEW
81.0000 mg | CHEWABLE_TABLET | Freq: Every day | ORAL | Status: DC
  Administered 2012-04-04 – 2012-04-07 (×3): 81 mg via ORAL
  Filled 2012-04-04 (×3): qty 1

## 2012-04-04 MED ORDER — GLIMEPIRIDE 2 MG PO TABS
2.0000 mg | ORAL_TABLET | Freq: Every day | ORAL | Status: DC
  Administered 2012-04-05 – 2012-04-07 (×2): 2 mg via ORAL
  Filled 2012-04-04 (×5): qty 1

## 2012-04-04 MED ORDER — POTASSIUM CHLORIDE CRYS ER 20 MEQ PO TBCR
40.0000 meq | EXTENDED_RELEASE_TABLET | Freq: Three times a day (TID) | ORAL | Status: AC
  Administered 2012-04-04 – 2012-04-05 (×3): 40 meq via ORAL
  Filled 2012-04-04 (×3): qty 2

## 2012-04-04 NOTE — Progress Notes (Addendum)
[name].  66 y.o.  male  Subjective: No symptoms since admission; no complaints at all. He notes that he has never been told of hyperglycemia in the past.  Allergy: Review of patient's allergies indicates no known allergies.; however, he has experienced myalgias with 3 different statins.  Objective: Vital signs in last 24 hours: Temp:  [98.2 F (36.8 C)-99 F (37.2 C)] 99 F (37.2 C) (12/28 0502) Pulse Rate:  [84-107] 84  (12/28 1040) Resp:  [15-24] 20  (12/28 0502) BP: (112-157)/(67-99) 130/67 mmHg (12/28 1040) SpO2:  [94 %-97 %] 97 % (12/28 0502) Weight:  [114.896 kg (253 lb 4.8 oz)-116.393 kg (256 lb 9.6 oz)] 116.393 kg (256 lb 9.6 oz) (12/28 0502)  116.393 kg (256 lb 9.6 oz) Body mass index is 34.80 kg/(m^2).  Weight change:  Last BM Date: 04/03/12  Intake/Output from previous day: 12/27 0701 - 12/28 0700 In: 286 [I.V.:286] Out: -   General- Well developed; no acute distress  Neck- No JVD, no carotid bruits Lungs- clear lung fields; normal I:E ratio Cardiovascular- normal PMI; distant S1 and S2; faint systolic murmur Abdomen- normal bowel sounds; soft and non-tender without masses or organomegaly Skin- Warm, no significant lesions Extremities- Nl distal pulses except decreased right posterior tibial; trace edema  Lab Results: Cardiac Markers:   Basename 04/04/12 0823 04/04/12 0333  TROPONINI <0.30 <0.30   POC troponins minimally elevated  CBC:   Basename 04/04/12 0332 04/03/12 1525  WBC 4.6 4.0  HGB 15.0 14.7  HCT 44.0 44.2  PLT 141* 140*   BMET:  Basename 04/04/12 0332 04/03/12 1525  NA 135 135  K 3.0* 3.5  CL 96 96  CO2 28 25  GLUCOSE 236* 282*  BUN 22 23  CREATININE 1.12 1.04  CALCIUM 9.2 9.2   Hepatic Function:   Basename 04/04/12 0332  PROT 6.9  ALBUMIN 3.8  AST 21  ALT 25  ALKPHOS 80  BILITOT 0.6  BILIDIR --  IBILI --   EKG:  NSR; borderline first degree AV block; probable inferior MI, age undetermined. No change since previous  tracing of one month earlier.  Imaging Studies/Results: Dg Chest 2 View  04/03/2012  *RADIOLOGY REPORT*  Clinical Data: Chest pain, shortness of breath.  CHEST - 2 VIEW  Comparison: 01/30/2010  Findings: Prominent spurring about both acromioclavicular joints. Flowing osteophytes at multiple contiguous levels in the mid thoracic spine. Lungs clear.  Heart size and pulmonary vascularity normal.  No effusion.  IMPRESSION: No acute disease   Original Report Authenticated By: D. Andria Rhein, MD    Imaging: Imaging results have been reviewed  Medications:  I have reviewed the patient's current medications. Scheduled:    . aspirin  81 mg Oral Daily  . glimepiride  2 mg Oral Q breakfast  . insulin aspart  0-15 Units Subcutaneous TID WC  . metoprolol tartrate  25 mg Oral BID  . potassium chloride  40 mEq Oral TID  . sodium chloride  3 mL Intravenous Q12H  Infusions:     . heparin 2,100 Units/hr (04/04/12 0925)   Assessment/Plan: Acute coronary syndrome-unstable angina: Initial point-of-care troponins were slightly positive, but subsequent laboratory values have been normal. No acute EKG changes. This does not meet criteria for an acute myocardial infarction. Metoprolol will be added to current medication both for treatment of coronary artery disease and improved blood pressure control. With borderline first degree AV block, PR interval will be monitored.  Hypertension: Suboptimal control; metoprolol added.  Hyperlipidemia:  No profile available since 2009; one will be obtained.  Treatment with a statin is desirable, but apparently not feasible. We will determine whether other classes of drugs should be utilized.  Hypokalemia: Treated with HCTZ as an outpatient. Potassium will be replaced.  Thrombocytopenia: Very mild and present since at least 01/2010  Diabetes mellitus: Serum glucose 123-189 in 01/2010. Diabetes has progressed and will now require pharmacologic therapy. Metformin is the  preferred agent, but will be held pending catheterization and glipizide started.   LOS: 1 day   [place] Bing 04/04/2012, 11:20 AM

## 2012-04-04 NOTE — Progress Notes (Signed)
ANTICOAGULATION CONSULT NOTE - Follow Up Consult  Pharmacy Consult for heparin Indication: chest pain/ACS  Allergies  Allergen Reactions  . Statins Other (See Comments)    Myalgias with 3 different statins    Patient Measurements: Ht: 6'' Wt: 120kg IBW= 73kg Heparin dosing weight= 100kg  Vital Signs: Temp: 98.4 F (36.9 C) (12/28 1400) Temp src: Oral (12/28 1400) BP: 143/78 mmHg (12/28 1400) Pulse Rate: 84  (12/28 1400)  Labs:  Basename 04/04/12 1527 04/04/12 0823 04/04/12 0333 04/04/12 0332 04/03/12 2128 04/03/12 1525 04/03/12 1256  HGB -- -- -- 15.0 -- 14.7 --  HCT -- -- -- 44.0 -- 44.2 44.3  PLT -- -- -- 141* -- 140* 157  APTT -- -- -- -- -- -- --  LABPROT -- -- -- -- -- -- 11.8  INR -- -- -- -- -- -- 0.87  HEPARINUNFRC 0.41 0.17* -- 0.22* -- -- --  CREATININE -- -- -- 1.12 -- 1.04 1.05  CKTOTAL -- -- -- -- -- -- --  CKMB -- -- -- -- -- -- --  TROPONINI -- <0.30 <0.30 -- <0.30 -- --     Medical History: Past Medical History  Diagnosis Date  . CAD (coronary artery disease)     a. s/p PCI to RCA 2002;  b. NSTEMI 10/11: cath with pLAD 30%, mLAD 30-40%, pCFX 20%, mCFX 60%, dCFX occluded (med Rx); pRCA 20%, dRCA 95% before stent tx with BMS, RCA stent ok, m-d PDA 90-95% (med Rx); pPL2 80% tx with DES; EF 55%  . Hyperlipidemia     intolerant to statins  . Hypertension   . TIA (transient ischemic attack) 2002  . Glucose intolerance (impaired glucose tolerance)     Assessment: 66 yo male here with CP on IV heparin. Heparin is now therapeutic.  Goal of Therapy:  Heparin level 0.3-0.7 units/ml Monitor platelets by anticoagulation protocol: Yes   Plan:  1. Cont heparin drip at 2100 units/hr 2. F/u with AM level

## 2012-04-04 NOTE — Progress Notes (Signed)
ANTICOAGULATION CONSULT NOTE - Follow Up Consult  Pharmacy Consult for heparin Indication: chest pain/ACS  No Known Allergies  Patient Measurements: Ht: 6'' Wt: 120kg IBW= 73kg Heparin dosing weight= 100kg  Vital Signs: Temp: 98.4 F (36.9 C) (12/27 2038) Temp src: Oral (12/27 2038) BP: 149/86 mmHg (12/27 2038) Pulse Rate: 100  (12/27 2038)  Labs:  Basename 04/04/12 0037 04/03/12 2128 04/03/12 1525 04/03/12 1256  HGB -- -- 14.7 14.9  HCT -- -- 44.2 44.3  PLT -- -- 140* 157  APTT -- -- -- --  LABPROT -- -- -- 11.8  INR -- -- -- 0.87  HEPARINUNFRC <0.10* -- -- --  CREATININE -- -- 1.04 1.05  CKTOTAL -- -- -- --  CKMB -- -- -- --  TROPONINI -- <0.30 -- --     Medical History: Past Medical History  Diagnosis Date  . CAD (coronary artery disease)     a. s/p PCI to RCA 2002;  b. NSTEMI 10/11: cath with pLAD 30%, mLAD 30-40%, pCFX 20%, mCFX 60%, dCFX occluded (med Rx); pRCA 20%, dRCA 95% before stent tx with BMS, RCA stent ok, m-d PDA 90-95% (med Rx); pPL2 80% tx with DES; EF 55%  . Hyperlipidemia     intolerant to statins  . Hypertension   . TIA (transient ischemic attack) 2002  . Glucose intolerance (impaired glucose tolerance)     Assessment: 66 yo male here with CP on IV heparin. Heparin level (<0.1) is below-goal on 1400 units/hr. No problem with line / infusion and no bleeding per RN.   Goal of Therapy:  Heparin level 0.3-0.7 units/ml Monitor platelets by anticoagulation protocol: Yes   Plan:  1. Heparin IV bolus of 3000 units x 1, then increase IV infusion to 1800 units/hr.  2. Heparin level in 6 hours.   Lorre Munroe, PharmD 04/04/2012 2:30 AM

## 2012-04-04 NOTE — Progress Notes (Signed)
  Echocardiogram 2D Echocardiogram has been performed.  [name] 04/04/2012, 5:30 PM

## 2012-04-04 NOTE — Progress Notes (Signed)
ANTICOAGULATION CONSULT NOTE - Follow Up Consult  Pharmacy Consult for heparin Indication: chest pain/ACS  No Known Allergies  Patient Measurements: Ht: 6'' Wt: 120kg IBW= 73kg Heparin dosing weight= 100kg  Vital Signs: Temp: 99 F (37.2 C) (12/28 0502) Temp src: Oral (12/28 0502) BP: 112/75 mmHg (12/28 0502) Pulse Rate: 85  (12/28 0502)  Labs:  [name] 04/04/12 9604 04/04/12 0333 04/04/12 0332 04/04/12 0037 04/03/12 2128 04/03/12 1525 04/03/12 1256  HGB -- -- 15.0 -- -- 14.7 --  HCT -- -- 44.0 -- -- 44.2 44.3  PLT -- -- 141* -- -- 140* 157  APTT -- -- -- -- -- -- --  LABPROT -- -- -- -- -- -- 11.8  INR -- -- -- -- -- -- 0.87  HEPARINUNFRC 0.17* -- 0.22* <0.10* -- -- --  CREATININE -- -- 1.12 -- -- 1.04 1.05  CKTOTAL -- -- -- -- -- -- --  CKMB -- -- -- -- -- -- --  TROPONINI -- <0.30 -- -- <0.30 -- --     Medical History: Past Medical History  Diagnosis Date  . CAD (coronary artery disease)     a. s/p PCI to RCA 2002;  b. NSTEMI 10/11: cath with pLAD 30%, mLAD 30-40%, pCFX 20%, mCFX 60%, dCFX occluded (med Rx); pRCA 20%, dRCA 95% before stent tx with BMS, RCA stent ok, m-d PDA 90-95% (med Rx); pPL2 80% tx with DES; EF 55%  . Hyperlipidemia     intolerant to statins  . Hypertension   . TIA (transient ischemic attack) 2002  . Glucose intolerance (impaired glucose tolerance)     Assessment: 66 yo male here with CP on IV heparin. Heparin level (0.17) is below-goal on 1800 units/hr. No problem with line / infusion and no bleeding per RN. CrCl ~80 ml/min, CBC stable, CE (-) x2  Goal of Therapy:  Heparin level 0.3-0.7 units/ml Monitor platelets by anticoagulation protocol: Yes   Plan:  1. Heparin IV bolus of 3000 units x 1, then increase IV infusion to 2100 units/hr.  2. Heparin level in 6 hours.  3. Monitor for s/sx bleeding  Thank you for the consult.  Tomi Bamberger, PharmD Clinical Pharmacist Pager: [phone] Pharmacy: [phone] 04/04/2012 9:11 AM

## 2012-04-05 DIAGNOSIS — E119 Type 2 diabetes mellitus without complications: Secondary | ICD-10-CM

## 2012-04-05 LAB — CBC
HCT: 40.8 % (ref 39.0–52.0)
Hemoglobin: 13 g/dL (ref 13.0–17.0)
RBC: 4.66 MIL/uL (ref 4.22–5.81)
RDW: 13.1 % (ref 11.5–15.5)
WBC: 5 10*3/uL (ref 4.0–10.5)

## 2012-04-05 LAB — GLUCOSE, CAPILLARY
Glucose-Capillary: 222 mg/dL — ABNORMAL HIGH (ref 70–99)
Glucose-Capillary: 155 mg/dL — ABNORMAL HIGH (ref 70–99)

## 2012-04-05 LAB — BASIC METABOLIC PANEL
Chloride: 102 mEq/L (ref 96–112)
GFR calc non Af Amer: 65 mL/min — ABNORMAL LOW (ref >90)
Glucose, Bld: 210 mg/dL — ABNORMAL HIGH (ref 70–99)
Potassium: 3.5 mEq/L (ref 3.5–5.1)
Sodium: 139 mEq/L (ref 135–145)

## 2012-04-05 MED ORDER — SODIUM CHLORIDE 0.9 % IV SOLN: 1.0000 mL/kg/h | INTRAVENOUS | Status: DC

## 2012-04-05 MED ORDER — SODIUM CHLORIDE 0.9 % IV SOLN: 250.0000 mL | INTRAVENOUS | Status: DC | PRN

## 2012-04-05 MED ORDER — SODIUM CHLORIDE 0.9 % IV SOLN: INTRAVENOUS | Status: DC

## 2012-04-05 MED ORDER — POTASSIUM CHLORIDE CRYS ER 20 MEQ PO TBCR
40.0000 meq | EXTENDED_RELEASE_TABLET | Freq: Once | ORAL | Status: AC
  Filled 2012-04-05: qty 2

## 2012-04-05 MED ORDER — ASPIRIN 81 MG PO CHEW
324.0000 mg | CHEWABLE_TABLET | ORAL | Status: AC
  Administered 2012-04-06: 324 mg via ORAL

## 2012-04-05 MED ORDER — SODIUM CHLORIDE 0.9 % IJ SOLN: 3.0000 mL | INTRAMUSCULAR | Status: DC | PRN

## 2012-04-05 MED ORDER — SODIUM CHLORIDE 0.9 % IJ SOLN
3.0000 mL | Freq: Two times a day (BID) | INTRAMUSCULAR | Status: DC
  Administered 2012-04-05: 3 mL via INTRAVENOUS

## 2012-04-05 MED ORDER — DIAZEPAM 5 MG PO TABS
5.0000 mg | ORAL_TABLET | ORAL | Status: AC
  Administered 2012-04-06: 5 mg via ORAL
  Filled 2012-04-05: qty 1

## 2012-04-05 MED ORDER — ASPIRIN 81 MG PO CHEW
324.0000 mg | CHEWABLE_TABLET | ORAL | Status: DC
  Filled 2012-04-05: qty 4

## 2012-04-05 NOTE — Progress Notes (Signed)
ANTICOAGULATION CONSULT NOTE - Follow Up Consult  Pharmacy Consult for heparin Indication: chest pain/ACS  Allergies  Allergen Reactions  . Statins Other (See Comments)    Myalgias with 3 different statins    Patient Measurements: Ht: 6'' Wt: 120kg IBW= 73kg Heparin dosing weight= 100kg  Vital Signs: Temp: 98.3 F (36.8 C) (12/29 0500) BP: 127/82 mmHg (12/29 0500) Pulse Rate: 66  (12/29 0500)  Labs:  Basename 04/05/12 0545 04/04/12 1527 04/04/12 0823 04/04/12 0333 04/04/12 0332 04/03/12 2128 04/03/12 1525 04/03/12 1256  HGB 13.0 -- -- -- 15.0 -- -- --  HCT 40.8 -- -- -- 44.0 -- 44.2 --  PLT 129* -- -- -- 141* -- 140* --  APTT -- -- -- -- -- -- -- --  LABPROT -- -- -- -- -- -- -- 11.8  INR -- -- -- -- -- -- -- 0.87  HEPARINUNFRC 0.28* 0.41 0.17* -- -- -- -- --  CREATININE 1.13 -- -- -- 1.12 -- 1.04 --  CKTOTAL -- -- -- -- -- -- -- --  CKMB -- -- -- -- -- -- -- --  TROPONINI -- -- <0.30 <0.30 -- <0.30 -- --     Medical History: Past Medical History  Diagnosis Date  . CAD (coronary artery disease)     a. s/p PCI to RCA 2002;  b. NSTEMI 10/11: cath with pLAD 30%, mLAD 30-40%, pCFX 20%, mCFX 60%, dCFX occluded (med Rx); pRCA 20%, dRCA 95% before stent tx with BMS, RCA stent ok, m-d PDA 90-95% (med Rx); pPL2 80% tx with DES; EF 55%  . Hyperlipidemia     intolerant to statins  . Hypertension   . TIA (transient ischemic attack) 2002  . Glucose intolerance (impaired glucose tolerance)     Assessment: 66 yo male here with CP on IV heparin. Heparin again slightly SUB-therapeutic at 0.28. Pt has ruled out for an MI, but heparin to continue until cath planned for tomorrow (12/30) per Cardiology. Renal function stable. No infusion line or bleeding issues per RN.  Goal of Therapy:  Heparin level 0.3-0.7 units/ml Monitor platelets by anticoagulation protocol: Yes   Plan:  1. Increase heparin drip to 2300 units/hr 2. F/u with AM level 3. F/u plans for cath   Thank you  for the consult.  Tomi Bamberger, PharmD Clinical Pharmacist Pager: [phone] Pharmacy: [phone] 04/05/2012 7:52 AM

## 2012-04-05 NOTE — Progress Notes (Signed)
Andrew P Acheampong Jr.  66 y.o.  male  Subjective: No symptoms since admission.  Allergy: Statins  Objective: Vital signs in last 24 hours: Temp:  [97.9 F (36.6 C)-98.4 F (36.9 C)] 98.3 F (36.8 C) (12/29 0500) Pulse Rate:  [66-84] 66  (12/29 0500) Resp:  [20] 20  (12/29 0500) BP: (120-143)/(67-82) 127/82 mmHg (12/29 0500) SpO2:  [92 %-94 %] 93 % (12/29 0500) Weight:  [116.983 kg (257 lb 14.4 oz)] 116.983 kg (257 lb 14.4 oz) (12/29 0500)  116.983 kg (257 lb 14.4 oz) Body mass index is 34.98 kg/(m^2).  Weight change: 2.087 kg (4 lb 9.6 oz) Last BM Date: 04/04/12  Intake/Output from previous day: 12/28 0701 - 12/29 0700 In: 941.4 [P.O.:480; I.V.:461.4] Out: -   General- Well developed; no acute distress; overweight Neck- No JVD, no carotid bruits Lungs- clear lung fields; normal I:E ratio Cardiovascular- normal PMI; distant S1 and S2; faint systolic murmur Abdomen- normal bowel sounds; soft and non-tender without masses or organomegaly Skin- Warm, no significant lesions Extremities- Nl distal pulses except decreased right posterior tibial; trace edema  Lab Results: Cardiac Markers:    Basename 04/04/12 0823 04/04/12 0333  TROPONINI <0.30 <0.30   POC troponins minimally elevated  CBC:    Basename 04/05/12 0545 04/04/12 0332  WBC 5.0 4.6  HGB 13.0 15.0  HCT 40.8 44.0  PLT 129* 141*   BMET:   Basename 04/05/12 0545 04/04/12 0332  NA 139 135  K 3.5 3.0*  CL 102 96  CO2 25 28  GLUCOSE 210* 236*  BUN 19 22  CREATININE 1.13 1.12  CALCIUM 8.8 9.2   Hepatic Function:    Basename 04/04/12 0332  PROT 6.9  ALBUMIN 3.8  AST 21  ALT 25  ALKPHOS 80  BILITOT 0.6  BILIDIR --  IBILI --   EKG:  NSR; borderline first degree AV block; probable inferior MI, age undetermined. No change since previous tracing of one month earlier.  Imaging Studies/Results: Dg Chest 2 View  04/03/2012  *RADIOLOGY REPORT*  Clinical Data: Chest pain, shortness of breath.  CHEST - 2 VIEW   Comparison: 01/30/2010  Findings: Prominent spurring about both acromioclavicular joints. Flowing osteophytes at multiple contiguous levels in the mid thoracic spine. Lungs clear.  Heart size and pulmonary vascularity normal.  No effusion.  IMPRESSION: No acute disease   Original Report Authenticated By: D. Hassell III, MD    Imaging: Imaging results have been reviewed  Medications:  I have reviewed the patient's current medications. Scheduled:    . aspirin  81 mg Oral Daily  . glimepiride  2 mg Oral Q breakfast  . insulin aspart  0-15 Units Subcutaneous TID WC  . metoprolol tartrate  25 mg Oral BID  . potassium chloride  40 mEq Oral TID  . sodium chloride  3 mL Intravenous Q12H  Infusions:      . heparin 2,300 Units/hr (04/05/12 0753)   Assessment/Plan: Acute coronary syndrome-unstable angina: Continues to be asymptomatic with current therapy; cardiac catheterization planned for 12/30.  Hypertension: Excellent response to metoprolol and bed rest; blood pressure has been normal for the past 24 hours.  Hyperlipidemia: Total cholesterol-181, TG-546, HDL-21. LDL cholesterol could not be calculated, but likely is not very high. Low HDL and high triglycerides likely at least partially related to uncontrolled diabetes. Fenofibrate will be added to his medical regime.  Hypokalemia: Potassium improved; additional replacement will be provided today..  Thrombocytopenia: Very mild and present since at least 01/2010  Diabetes   mellitus: CBGs 200-300.  Continue initial therapy with Amaryl and insulin coverage. At discharge will plan to change Amaryl to metformin.  LOS: 2 days   Sindee Benjamin 04/05/2012, 8:30 AM    

## 2012-04-06 ENCOUNTER — Encounter (HOSPITAL_COMMUNITY)
Admission: EM | Disposition: A | Discharge status: Home / Self Care | Payer: Self-pay | Source: Home / Self Care | Attending: Cardiology

## 2012-04-06 DIAGNOSIS — I2510 Atherosclerotic heart disease of native coronary artery without angina pectoris: Secondary | ICD-10-CM

## 2012-04-06 HISTORY — PX: PERCUTANEOUS STENT INTERVENTION: SHX5500

## 2012-04-06 HISTORY — PX: LEFT HEART CATHETERIZATION WITH CORONARY ANGIOGRAM: SHX5451

## 2012-04-06 LAB — POCT ACTIVATED CLOTTING TIME: Activated Clotting Time: 475 seconds

## 2012-04-06 LAB — CBC
MCH: 28.9 pg (ref 26.0–34.0)
MCHC: 32.8 g/dL (ref 30.0–36.0)
Platelets: 142 10*3/uL — ABNORMAL LOW (ref 150–400)
RBC: 4.39 MIL/uL (ref 4.22–5.81)

## 2012-04-06 LAB — GLUCOSE, CAPILLARY
Glucose-Capillary: 190 mg/dL — ABNORMAL HIGH (ref 70–99)
Glucose-Capillary: 175 mg/dL — ABNORMAL HIGH (ref 70–99)
Glucose-Capillary: 158 mg/dL — ABNORMAL HIGH (ref 70–99)

## 2012-04-06 SURGERY — LEFT HEART CATHETERIZATION WITH CORONARY ANGIOGRAM
Anesthesia: LOCAL | Laterality: Right

## 2012-04-06 SURGERY — LEFT HEART CATHETERIZATION WITH CORONARY ANGIOGRAM
Anesthesia: LOCAL

## 2012-04-06 MED ORDER — MORPHINE SULFATE 2 MG/ML IJ SOLN: 2.0000 mg | INTRAMUSCULAR | Status: DC | PRN

## 2012-04-06 MED ORDER — LIDOCAINE HCL (PF) 1 % IJ SOLN
INTRAMUSCULAR | Status: AC
  Filled 2012-04-06: qty 30

## 2012-04-06 MED ORDER — AMLODIPINE BESYLATE 5 MG PO TABS
5.0000 mg | ORAL_TABLET | Freq: Every day | ORAL | Status: DC
  Administered 2012-04-06 – 2012-04-07 (×2): 5 mg via ORAL
  Filled 2012-04-06 (×2): qty 1

## 2012-04-06 MED ORDER — NITROGLYCERIN 0.2 MG/ML ON CALL CATH LAB
INTRAVENOUS | Status: AC
  Filled 2012-04-06: qty 1

## 2012-04-06 MED ORDER — BIVALIRUDIN 250 MG IV SOLR
INTRAVENOUS | Status: AC
  Filled 2012-04-06: qty 250

## 2012-04-06 MED ORDER — MIDAZOLAM HCL 2 MG/2ML IJ SOLN
INTRAMUSCULAR | Status: AC
  Filled 2012-04-06: qty 2

## 2012-04-06 MED ORDER — HEPARIN (PORCINE) IN NACL 2-0.9 UNIT/ML-% IJ SOLN
INTRAMUSCULAR | Status: AC
  Filled 2012-04-06: qty 1000

## 2012-04-06 MED ORDER — ACETAMINOPHEN 325 MG PO TABS: 650.0000 mg | ORAL_TABLET | ORAL | Status: DC | PRN

## 2012-04-06 MED ORDER — SODIUM CHLORIDE 0.9 % IV SOLN: 250.0000 mL | INTRAVENOUS | Status: DC

## 2012-04-06 MED ORDER — HYDROCHLOROTHIAZIDE 25 MG PO TABS
25.0000 mg | ORAL_TABLET | Freq: Every day | ORAL | Status: DC
  Administered 2012-04-06 – 2012-04-07 (×2): 25 mg via ORAL
  Filled 2012-04-06 (×2): qty 1

## 2012-04-06 MED ORDER — CLOPIDOGREL BISULFATE 75 MG PO TABS
75.0000 mg | ORAL_TABLET | Freq: Every day | ORAL | Status: DC
  Administered 2012-04-07: 75 mg via ORAL
  Filled 2012-04-06: qty 1

## 2012-04-06 MED ORDER — CLOPIDOGREL BISULFATE 300 MG PO TABS
ORAL_TABLET | ORAL | Status: AC
  Filled 2012-04-06: qty 2

## 2012-04-06 MED ORDER — LIVING WELL WITH DIABETES BOOK
Freq: Once | Status: AC
  Administered 2012-04-06: 18:00:00
  Filled 2012-04-06 (×2): qty 1

## 2012-04-06 MED ORDER — SODIUM CHLORIDE 0.9 % IJ SOLN
3.0000 mL | Freq: Two times a day (BID) | INTRAMUSCULAR | Status: DC
  Administered 2012-04-06 – 2012-04-07 (×3): 3 mL via INTRAVENOUS

## 2012-04-06 MED ORDER — OXYCODONE-ACETAMINOPHEN 5-325 MG PO TABS
1.0000 | ORAL_TABLET | ORAL | Status: DC | PRN
Start: 2012-04-06 — End: 2012-04-07

## 2012-04-06 MED ORDER — HEPARIN (PORCINE) IN NACL 2-0.9 UNIT/ML-% IJ SOLN
INTRAMUSCULAR | Status: AC
  Filled 2012-04-06: qty 500

## 2012-04-06 MED ORDER — ONDANSETRON HCL 4 MG/2ML IJ SOLN: 4.0000 mg | Freq: Four times a day (QID) | INTRAMUSCULAR | Status: DC | PRN

## 2012-04-06 MED ORDER — SODIUM CHLORIDE 0.9 % IV SOLN
1.0000 mL/kg/h | INTRAVENOUS | Status: AC
  Administered 2012-04-06: 15:00:00 1 mL/kg/h via INTRAVENOUS

## 2012-04-06 MED ORDER — SODIUM CHLORIDE 0.9 % IJ SOLN: 3.0000 mL | INTRAMUSCULAR | Status: DC | PRN

## 2012-04-06 MED ORDER — SODIUM CHLORIDE 0.9 % IV SOLN
0.2500 mg/kg/h | INTRAVENOUS | Status: DC
  Filled 2012-04-06: qty 250

## 2012-04-06 MED ORDER — HEPARIN SODIUM (PORCINE) 1000 UNIT/ML IJ SOLN
INTRAMUSCULAR | Status: AC
  Filled 2012-04-06: qty 1

## 2012-04-06 MED ORDER — FENTANYL CITRATE 0.05 MG/ML IJ SOLN
INTRAMUSCULAR | Status: AC
  Filled 2012-04-06: qty 2

## 2012-04-06 MED ORDER — VERAPAMIL HCL 2.5 MG/ML IV SOLN
INTRAVENOUS | Status: AC
  Filled 2012-04-06: qty 2

## 2012-04-06 NOTE — Progress Notes (Signed)
Utilization Review Completed.   Tranisha Tissue, RN, BSN Nurse Case Manager  [phone]  

## 2012-04-06 NOTE — H&P (View-Only) (Signed)
[name].  66 y.o.  male  Subjective: No symptoms since admission.  Allergy: Statins  Objective: Vital signs in last 24 hours: Temp:  [97.9 F (36.6 C)-98.4 F (36.9 C)] 98.3 F (36.8 C) (12/29 0500) Pulse Rate:  [66-84] 66  (12/29 0500) Resp:  [20] 20  (12/29 0500) BP: (120-143)/(67-82) 127/82 mmHg (12/29 0500) SpO2:  [92 %-94 %] 93 % (12/29 0500) Weight:  [116.983 kg (257 lb 14.4 oz)] 116.983 kg (257 lb 14.4 oz) (12/29 0500)  116.983 kg (257 lb 14.4 oz) Body mass index is 34.98 kg/(m^2).  Weight change: 2.087 kg (4 lb 9.6 oz) Last BM Date: 04/04/12  Intake/Output from previous day: 12/28 0701 - 12/29 0700 In: 941.4 [P.O.:480; I.V.:461.4] Out: -   General- Well developed; no acute distress; overweight Neck- No JVD, no carotid bruits Lungs- clear lung fields; normal I:E ratio Cardiovascular- normal PMI; distant S1 and S2; faint systolic murmur Abdomen- normal bowel sounds; soft and non-tender without masses or organomegaly Skin- Warm, no significant lesions Extremities- Nl distal pulses except decreased right posterior tibial; trace edema  Lab Results: Cardiac Markers:    Basename 04/04/12 0823 04/04/12 0333  TROPONINI <0.30 <0.30   POC troponins minimally elevated  CBC:    Basename 04/05/12 0545 04/04/12 0332  WBC 5.0 4.6  HGB 13.0 15.0  HCT 40.8 44.0  PLT 129* 141*   BMET:   Basename 04/05/12 0545 04/04/12 0332  NA 139 135  K 3.5 3.0*  CL 102 96  CO2 25 28  GLUCOSE 210* 236*  BUN 19 22  CREATININE 1.13 1.12  CALCIUM 8.8 9.2   Hepatic Function:    Basename 04/04/12 0332  PROT 6.9  ALBUMIN 3.8  AST 21  ALT 25  ALKPHOS 80  BILITOT 0.6  BILIDIR --  IBILI --   EKG:  NSR; borderline first degree AV block; probable inferior MI, age undetermined. No change since previous tracing of one month earlier.  Imaging Studies/Results: Dg Chest 2 View  04/03/2012  *RADIOLOGY REPORT*  Clinical Data: Chest pain, shortness of breath.  CHEST - 2 VIEW   Comparison: 01/30/2010  Findings: Prominent spurring about both acromioclavicular joints. Flowing osteophytes at multiple contiguous levels in the mid thoracic spine. Lungs clear.  Heart size and pulmonary vascularity normal.  No effusion.  IMPRESSION: No acute disease   Original Report Authenticated By: D. Andria Rhein, MD    Imaging: Imaging results have been reviewed  Medications:  I have reviewed the patient's current medications. Scheduled:    . aspirin  81 mg Oral Daily  . glimepiride  2 mg Oral Q breakfast  . insulin aspart  0-15 Units Subcutaneous TID WC  . metoprolol tartrate  25 mg Oral BID  . potassium chloride  40 mEq Oral TID  . sodium chloride  3 mL Intravenous Q12H  Infusions:      . heparin 2,300 Units/hr (04/05/12 0753)   Assessment/Plan: Acute coronary syndrome-unstable angina: Continues to be asymptomatic with current therapy; cardiac catheterization planned for 12/30.  Hypertension: Excellent response to metoprolol and bed rest; blood pressure has been normal for the past 24 hours.  Hyperlipidemia: Total cholesterol-181, TG-546, HDL-21. LDL cholesterol could not be calculated, but likely is not very high. Low HDL and high triglycerides likely at least partially related to uncontrolled diabetes. Fenofibrate will be added to his medical regime.  Hypokalemia: Potassium improved; additional replacement will be provided today..  Thrombocytopenia: Very mild and present since at least 01/2010  Diabetes  mellitus: CBGs 200-300.  Continue initial therapy with Amaryl and insulin coverage. At discharge will plan to change Amaryl to metformin.  LOS: 2 days   [name] 04/05/2012, 8:30 AM

## 2012-04-06 NOTE — Progress Notes (Signed)
Inpatient Diabetes Program Recommendations  AACE/ADA: New Consensus Statement on Inpatient Glycemic Control (2013)  Target Ranges:  Prepandial:   less than 140 mg/dL      Peak postprandial:   less than 180 mg/dL (1-2 hours)      Critically ill patients:  140 - 180 mg/dL   Reason for Visit: New onset DM Have ordered patient education per bedside RN using ADA teaching booklet, DM network videos, and [facility] exit care notes. Ordered RD consult for basic diet education. If pt going to OP rehab, pt will get more education on diet and exercise in this series of classes. Otherwise, pt should attend OP education at [facility] at OP ed center.  Assume pt has primary care provider with appt set up at discharge (?)  Note: Thank you, Lenor Coffin, RN, CNS, Diabetes Coordinator [phone])

## 2012-04-06 NOTE — CV Procedure (Signed)
Cardiac Catheterization Procedure Note  Name: [name]. MRN: [id number] DOB: 1946/02/11  Procedure: Left Heart Cath, Selective Coronary Angiography, LV angiography, PTCA and stenting of the distal RCA  Indication: Unstable angina  Procedural Details:  The right wrist was prepped, draped, and anesthetized with 1% lidocaine. Using the modified Seldinger technique, a 5 French sheath was introduced into the right radial artery. 3 mg of verapamil was administered through the sheath, weight-based unfractionated heparin was administered intravenously. Standard Judkins catheters were used for selective coronary angiography and left ventriculography. Catheter exchanges were performed over an exchange length guidewire.  PROCEDURAL FINDINGS Hemodynamics: AO 105/63 LV 105/9   Coronary angiography: Coronary dominance: right  Left mainstem: Patent with mild luminal irregularity  Left anterior descending (LAD): The LAD is heavily calcified. There is very heavy calcification of proximal LAD without significant stenosis. The mid LAD is diffusely diseased with up to 50% stenosis. There is no critical stenosis throughout the LAD distribution. The first diagonal is tiny. The second diagonal is large with a tubular 60% stenosis involving the proximal aspect of that vessel.  Left circumflex (LCx): The circumflex is large throughout its proximal aspect. There are diffuse irregularities. The mid circumflex and trifurcation point has a 70% stenosis. This is in the area involving the origin of the first and second obtuse marginal branches. This is unchanged from the patient's previous study. The distal AV groove circumflex supplies an atrial branch.  Right coronary artery (RCA): The RCA is heavily calcified. The proximal vessel has irregularity. The mid vessel has diffuse 30-40% calcific stenosis. The distal vessel shows a patent stent followed by an area of moderately severe in-stent restenosis in the  distal RCA, estimated at 75%. This area has clearly progressed from the previous study 2 years ago. The more recently placed stent is widely patent. The PLA branch is patent, as is the stented segment in the PLA. The PDA branch is diffusely diseased with subtotal occlusion in the mid to distal aspect. This is unchanged from the previous study. The vessel is small throughout that region.   Left ventriculography: Left ventricular systolic function is normal, LVEF is estimated at 55-65%, there is no significant mitral regurgitation   PCI Note:  Following the diagnostic procedure, the decision was made to proceed with PCI. The radial sheath was upsized to a 6 [place]. Weight-based bivalirudin was given for anticoagulation.the patient was loaded with 600 mg of clopidogrel on the table. I chose Plavix because of his history of TIA. He also presented with dyspnea and I thought brilinta may complicate further evaluation of his shortness of breath.  Once a therapeutic ACT was achieved, a 6 [place] JR-4 guide catheter was inserted.  A [place] coronary guidewire was used to cross the lesion.  The lesion was predilated with a 3.0 x 15 mm [place] balloon to 16 ATM.   I then attempted to stent the vessel with a 3.5 x 16 mm Promus Premier drug-eluting stent. I was unable to pass the stent beyond the mid vessel despite aggressive guide positioning. I then buddy wired the RCA with a grand slam wire. Even with the buddy wire I was unable to pass the stent. I then tried to pass a guideliner catheter with a 2.0 telescoped through the distal end. This would not pass. We readvanced the original 3.0 mm balloon and I was able to position a guideliner down a little further. Even with that the stent would not pass. Finally, with further maneuvering of the  guideliner over a fielder XT wire I was able to position the stent in the appropriate lesion position and the stent was deployed at 14 atmospheres.   The stent was postdilated with a  3.75 x  12 mm  noncompliant balloon to 16 and an 18 atmospheres on consecutive inflations .  Following PCI, there was 0% residual stenosis and TIMI-3 flow. Final angiography confirmed an excellent result. The patient tolerated the procedure well. There were no immediate procedural complications. A TR band was used for radial hemostasis. The patient was transferred to the post catheterization recovery area for further monitoring.  PCI Data: Vessel -  RCA/Segment -  distal (in-stent) Percent Stenosis (pre)   75  TIMI-flow  3 Stent  3.5 x 16 mm drug-eluting Percent Stenosis (post)  0  TIMI-flow (post)  3   Final Conclusions:   1. Severe RCA stenosis with successful complex PCI as detailed above 2. Moderate left circumflex stenosis, unchanged from previous study 3. Nonobstructive LAD stenosis 4. Preserved LV systolic function    Recommendations:   dual antiplatelet therapy with aspirin and Plavix for a minimum of 12 months.   Tonny Bollman 04/06/2012, 12:02 PM

## 2012-04-06 NOTE — Progress Notes (Signed)
TR BAND REMOVAL  LOCATION:    right radial  DEFLATED PER PROTOCOL:    yes  TIME BAND OFF / DRESSING APPLIED:    1600   SITE UPON ARRIVAL:    Level 0  SITE AFTER BAND REMOVAL:    Level 0  REVERSE ALLEN'S TEST:     positive  CIRCULATION SENSATION AND MOVEMENT:    Within Normal Limits   yes  COMMENTS:   Tolerated procedure well 

## 2012-04-06 NOTE — Interval H&P Note (Signed)
History and Physical Interval Note:  04/06/2012 10:26 AM  [name].  has presented today for surgery, with the diagnosis of cp  The various methods of treatment have been discussed with the patient and family. After consideration of risks, benefits and other options for treatment, the patient has consented to  Procedure(s) (LRB) with comments: LEFT HEART CATHETERIZATION WITH CORONARY ANGIOGRAM (Bilateral) as a surgical intervention .  The patient's history has been reviewed, patient examined, no change in status, stable for surgery.  I have reviewed the patient's chart and labs.  Questions were answered to the patient's satisfaction.    Pt evaluated. He remains CP-free. I have reviewed his meds and prior cath films. He and his wife understands risk of cath and possible PCI.   Tonny Bollman

## 2012-04-07 ENCOUNTER — Encounter (HOSPITAL_COMMUNITY): Payer: Self-pay | Admitting: Physician Assistant

## 2012-04-07 DIAGNOSIS — D696 Thrombocytopenia, unspecified: Secondary | ICD-10-CM

## 2012-04-07 DIAGNOSIS — E876 Hypokalemia: Secondary | ICD-10-CM

## 2012-04-07 LAB — BASIC METABOLIC PANEL
CO2: 25 mEq/L (ref 19–32)
Chloride: 100 mEq/L (ref 96–112)
Creatinine, Ser: 1.1 mg/dL (ref 0.50–1.35)
GFR calc Af Amer: 78 mL/min — ABNORMAL LOW (ref >90)
Potassium: 3.8 mEq/L (ref 3.5–5.1)

## 2012-04-07 LAB — CBC
HCT: 40.7 % (ref 39.0–52.0)
MCV: 86.6 fL (ref 78.0–100.0)
Platelets: 153 10*3/uL (ref 150–400)
RBC: 4.7 MIL/uL (ref 4.22–5.81)
RDW: 13 % (ref 11.5–15.5)
WBC: 5.8 10*3/uL (ref 4.0–10.5)

## 2012-04-07 LAB — GLUCOSE, CAPILLARY: Glucose-Capillary: 167 mg/dL — ABNORMAL HIGH (ref 70–99)

## 2012-04-07 MED ORDER — METFORMIN HCL 500 MG PO TABS: 500.0000 mg | ORAL_TABLET | Freq: Two times a day (BID) | ORAL | Status: DC

## 2012-04-07 MED ORDER — FENOFIBRATE 145 MG PO TABS: 145.0000 mg | ORAL_TABLET | Freq: Every day | ORAL | Status: DC

## 2012-04-07 MED ORDER — METOPROLOL TARTRATE 25 MG PO TABS: 25.0000 mg | ORAL_TABLET | Freq: Two times a day (BID) | ORAL | Status: DC

## 2012-04-07 MED ORDER — ASPIRIN 81 MG PO CHEW: 81.0000 mg | CHEWABLE_TABLET | Freq: Every day | ORAL | Status: AC

## 2012-04-07 MED ORDER — CLOPIDOGREL BISULFATE 75 MG PO TABS: 75.0000 mg | ORAL_TABLET | Freq: Every day | ORAL | Status: DC

## 2012-04-07 MED FILL — Dextrose Inj 5%: INTRAVENOUS | Qty: 50 | Status: AC

## 2012-04-07 NOTE — Progress Notes (Signed)
CARDIAC REHAB PHASE I   PRE:  Rate/Rhythm: 77SR  BP:  Supine:   Sitting: 135/84  Standing:    SaO2:   MODE:  Ambulation: 1000 ft   POST:  Rate/Rhythem: 94SR  BP:  Supine:   Sitting: 136/92  Standing:    SaO2:  0750-0900 Pt walked 1000 ft with steady gait. Denied Cp or SOB. Education completed. Encouraged pt to attend outpatient diabetic classes and watch diabetic videos here. Gave pt diabetic diet and discussed carb counting. Permission given to refer to [facility] Phase 2.  Duanne Limerick

## 2012-04-07 NOTE — Discharge Summary (Signed)
Discharge Summary   Patient ID: [name].,  MRN: [id number], DOB/AGE: 1945/12/15 66 y.o.  Admit date: 04/03/2012 Discharge date: 04/07/2012  Primary Physician: Provider Not In System Primary Cardiologist: Olga Millers, MD  Discharge Diagnoses Principal Problem:  *Angina pectoris, crescendo  - s/p cath 04/06/12- 75% distal RCA ISR s/p DES. 70% mid LCx unchanged from prior studies. Patent prior RCA, PLA stents. Subtotal mid-dis PDA occlusion. Diffuse nonobstructive disease elsewhere. LVEF 55-65%.  - DAPT- ASA/Plavix x 1 year  - TTE-  LVEF 55-60%, mild concentric LVH, mild RV dilatation Active Problems:  HYPERLIPIDEMIA-MIXED  - Lipid panel 04/04/12- TG 546, HDL 21, LDL unable to calculate, TC 181  - Fenofibrate added given statin intolerance  HYPERTENSION, UNSPECIFIED  - BB, CCB, diuretic  - Consider adding ACEi given new DM2 diagnosis  CAD, NATIVE VESSEL  - ASA/Plavix/BB/fibrate/NTG SL PRN  Diabetes mellitus, type II  - newly diagnosed, Hgb A1C 10.6%  - started on metformin   Obesity  Hypokalemia  Thrombocytopenia  - chronic, stable, no active bleeding   Allergies Allergies  Allergen Reactions  . Statins Other (See Comments)    Myalgias with 3 different statins    Diagnostic Studies/Procedures  PA/LATERAL CHEST X-RAY - 04/03/12  IMPRESSION:  No acute disease  TRANSTHORACIC ECHOCARDIOGRAM - 04/04/12  Study Conclusions  - Left ventricle: There was mild concentric hypertrophy. Systolic function was normal. The estimated ejection fraction was in the range of 55% to 60%. Wall motion was normal; there were no regional wall motion abnormalities.  - Aortic valve: Mildly calcified annulus. Trileaflet. - Right ventricle: The cavity size was mildly dilated. Wall thickness was normal. - Atrial septum: No defect or patent foramen ovale was identified.  CARDIAC CATHETERIZATION + PCI - 04/06/12  Hemodynamics:  AO 105/63  LV 105/9  Coronary angiography:    Coronary dominance: right  Left mainstem: Patent with mild luminal irregularity  Left anterior descending (LAD): The LAD is heavily calcified. There is very heavy calcification of proximal LAD without significant stenosis. The mid LAD is diffusely diseased with up to 50% stenosis. There is no critical stenosis throughout the LAD distribution. The first diagonal is tiny. The second diagonal is large with a tubular 60% stenosis involving the proximal aspect of that vessel.  Left circumflex (LCx): The circumflex is large throughout its proximal aspect. There are diffuse irregularities. The mid circumflex and trifurcation point has a 70% stenosis. This is in the area involving the origin of the first and second obtuse marginal branches. This is unchanged from the patient's previous study. The distal AV groove circumflex supplies an atrial branch.  Right coronary artery (RCA): The RCA is heavily calcified. The proximal vessel has irregularity. The mid vessel has diffuse 30-40% calcific stenosis. The distal vessel shows a patent stent followed by an area of moderately severe in-stent restenosis in the distal RCA, estimated at 75%. This area has clearly progressed from the previous study 2 years ago. The more recently placed stent is widely patent. The PLA branch is patent, as is the stented segment in the PLA. The PDA branch is diffusely diseased with subtotal occlusion in the mid to distal aspect. This is unchanged from the previous study. The vessel is small throughout that region.  Left ventriculography: Left ventricular systolic function is normal, LVEF is estimated at 55-65%, there is no significant mitral regurgitation   PCI Data:  Vessel - RCA/Segment - distal (in-stent)  Percent Stenosis (pre) 75  TIMI-flow 3  Stent 3.5 x  16 mm drug-eluting  Percent Stenosis (post) 0  TIMI-flow (post) 3   Final Conclusions:  1. Severe RCA stenosis with successful complex PCI as detailed above  2. Moderate left  circumflex stenosis, unchanged from previous study  3. Nonobstructive LAD stenosis  4. Preserved LV systolic function   History of Present Illness  [name] is a 66 yo male who was admitted to [facility] on 04/03/12 with the above problem list. He has a history of CAD s/p PCI to RCA, PLA. He has a history of HTN, HLD and newly diagnosed DM2 (this admission). He reported experiencing DOE ~ two months prior to admission, which had acutely worsened prior to admission. He reported having to stop regularly when carrying heavy objects or exerting himself at work. He then developed exertion SSCP 1 week prior to admission unrelieved by NTG with associated shortness of breath and resolving with rest. 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 On ED arrival, EKG revealed no acute ischemia, however POC TnI returned very mildly elevated. He was started on heparin and admitted for planned cardiac catheterization the following Monday.   Hospital Course   He remained pain free. Cardiac troponins were cycled and returned within normal limits. Heparin was discontinued as unstable angina over NSTEMI was more strongly supported. A TTE as above revealed LVEF 55-60%, mild concentric LVH, mild RV dilatation. A Hgb A1C returned elevated at 10.6%. A lipid panel was ordered revealing hyperlipidemia. Amaryl and SSI was initiated. BB was added for BP control. A mild hypokalemia was identified, and successfully repleted. CBC revealed a mild thrombocytopenia known to be chronic from 2011. No active bleeding. He remained stable over the weekend.  He was informed, consented and prepped for cardiac catheterization which is detailed in full above. DAPT- ASA/Plavix x 1 year was recommended. He tolerated the procedure well without complications and remained stable overnight. He ambulated well with cardiac rehab and R wrist access site remained stable.  He will be discharged today and follow-up with outpatient cardiac rehab in  [place]. He will continue the medications listed below. Of note, a fenofibrate has been started given history of myalgias on three separate statins. Metformin will replace Amaryl for glycemic control. He has been advised to establish with a PCP for ongoing management. For dual BP and reno-protective benefit, would consider started ACEi in place of or in addition to HCTZ. This information, including post-cath instructions, has been clearly outlined in the discharge AVS.   Discharge Vitals:  Blood pressure 135/84, pulse 76, temperature 98 F (36.7 C), temperature source Oral, resp. rate 22, height 6' (1.829 m), weight 116.3 kg (256 lb 6.3 oz), SpO2 93.00%.   Weight change: -0.592 kg (-1 lb 4.9 oz)  Labs: Recent Labs  [facility] 04/07/12 0643 04/06/12 0650   WBC 5.8 4.2   HGB 14.0 12.7*   HCT 40.7 38.7*   MCV 86.6 88.2   PLT 153 142*   Lab 04/07/12 0643 04/05/12 0545 04/04/12 0332  NA 136 139 135  K 3.8 3.5 3.0*  CL 100 102 96  CO2 25 25 28   BUN 13 19 22   CREATININE 1.10 1.13 1.12  CALCIUM 9.1 8.8 9.2  PROT -- -- 6.9  BILITOT -- -- 0.6  ALKPHOS -- -- 80  ALT -- -- 25  AST -- -- 21  AMYLASE -- -- --  LIPASE -- -- --  GLUCOSE 169* 210* 236*   Recent Labs  Basename 04/04/12 1200   CHOL 181  HDL 21*   LDLCALC UNABLE TO CALCULATE IF TRIGLYCERIDE OVER 400 mg/dL   TRIG 098*   CHOLHDL 8.6   LDLDIRECT --   Disposition:  Discharge Orders    Future Appointments: Provider: Department: Dept Phone: Center:   04/21/2012 2:00 PM Beatrice Lecher, PA [facility] Main Office [place]) [phone] [id number]     Future Orders Please Complete By Expires   Amb Referral to Cardiac Rehabilitation      Comments:   Referring to  Phase 2   Diet - low sodium heart healthy      Increase activity slowly        Follow-up Information    Follow up with Tereso Newcomer, PA. On 04/21/2012. (At 2:00 PM for follow-up. )    Contact information:   [address] [place] [address] [phone]          Discharge Medications:    Medication List     As of 04/07/2012 11:58 AM    START taking these medications         aspirin 81 MG chewable tablet   Chew 1 tablet (81 mg total) by mouth daily.   Replaces: aspirin 325 MG tablet      clopidogrel 75 MG tablet   Commonly known as: PLAVIX   Take 1 tablet (75 mg total) by mouth daily with breakfast.      fenofibrate 145 MG tablet   Commonly known as: TRICOR   Take 1 tablet (145 mg total) by mouth daily.      metFORMIN 500 MG tablet   Commonly known as: GLUCOPHAGE   Take 1 tablet (500 mg total) by mouth 2 (two) times daily with a meal.   Start taking on: 04/08/2012      metoprolol tartrate 25 MG tablet   Commonly known as: LOPRESSOR   Take 1 tablet (25 mg total) by mouth 2 (two) times daily.      CONTINUE taking these medications         amLODipine 5 MG tablet   Commonly known as: NORVASC   Take 1 tablet (5 mg total) by mouth daily.      hydrochlorothiazide 25 MG tablet   Commonly known as: HYDRODIURIL   Take 1 tablet (25 mg total) by mouth daily.      nitroGLYCERIN 0.4 MG SL tablet   Commonly known as: NITROSTAT   Place 1 tablet (0.4 mg total) under the tongue every 5 (five) minutes as needed.      STOP taking these medications         aspirin 325 MG tablet   Replaced by: aspirin 81 MG chewable tablet          Where to get your medications    These are the prescriptions that you need to pick up. We sent them to a specific pharmacy, so you will need to go there to get them.   [facility] [place] - [address]    [address] [place] [address]    Phone: [phone]        clopidogrel 75 MG tablet   fenofibrate 145 MG tablet   metFORMIN 500 MG tablet   metoprolol tartrate 25 MG tablet         You may get these medications from any pharmacy.         aspirin 81 MG chewable tablet           Outstanding  Labs/Studies:  None  Duration of Discharge Encounter: Greater than 30 minutes including physician time.  Signed, R. Hurman Horn, PA-C 04/07/2012, 11:58 AM

## 2012-04-07 NOTE — Progress Notes (Signed)
Patient Name: [name]. Date of Encounter: 04/07/2012     Principal Problem:  *Angina pectoris, crescendo Active Problems:  HYPERLIPIDEMIA-MIXED  HYPERTENSION, UNSPECIFIED  CAD, NATIVE VESSEL  DOE (dyspnea on exertion)  Diabetes mellitus, type II  Obesity    SUBJECTIVE: No chest pain, shortness of breath, lightheadedness, palpitations. Ambulated around the room without incident.    OBJECTIVE  Filed Vitals:   04/06/12 2000 04/06/12 2106 04/06/12 2355 04/07/12 0545  BP: 133/75  123/86 136/79  Pulse: 70 74 72 72  Temp: 98.1 F (36.7 C)  98.4 F (36.9 C) 98.2 F (36.8 C)  TempSrc: Oral  Oral Oral  Resp: 17  15 17   Height:      Weight:    116.3 kg (256 lb 6.3 oz)  SpO2: 96%  95% 92%    Intake/Output Summary (Last 24 hours) at 04/07/12 1610 Last data filed at 04/07/12 0000  Gross per 24 hour  Intake    591 ml  Output    925 ml  Net   -334 ml   Weight change: -0.592 kg (-1 lb 4.9 oz)  PHYSICAL EXAM  General: Well developed, well nourished, in no acute distress. Head: Normocephalic, atraumatic, sclera non-icteric, no xanthomas, nares are without discharge.  Neck: Supple without bruits or JVD. Lungs:  Resp regular and unlabored, CTA. Heart: RRR no s3, s4, or murmurs. Abdomen: Soft, non-tender, non-distended, BS + x 4.  Msk:  Strength and tone appears normal for age. Extremities: R wrist access site- no erythema, edema, induration or discharge. No clubbing, cyanosis or edema. DP/PT/Radials 2+ and equal bilaterally. Neuro: Alert and oriented X 3. Moves all extremities spontaneously. Psych: Normal affect.  LABS:  Recent Labs  [facility] 04/06/12 0650 04/05/12 0545   WBC 4.2 5.0   HGB 12.7* 13.0   HCT 38.7* 40.8   MCV 88.2 87.6   PLT 142* 129*   Lab 04/05/12 0545 04/04/12 0332 04/03/12 1525  NA 139 135 135  K 3.5 3.0* 3.5  CL 102 96 96  CO2 25 28 25   BUN 19 22 23   CREATININE 1.13 1.12 1.04  CALCIUM 8.8 9.2 9.2  PROT -- 6.9 --  BILITOT -- 0.6  --  ALKPHOS -- 80 --  ALT -- 25 --  AST -- 21 --  AMYLASE -- -- --  LIPASE -- -- --  GLUCOSE 210* 236* 282*   Recent Labs  Basename 04/04/12 0823   CKTOTAL --   CKMB --   CKMBINDEX --   TROPONINI <0.30   Recent Labs  Basename 04/04/12 1200   CHOL 181   HDL 21*   LDLCALC UNABLE TO CALCULATE IF TRIGLYCERIDE OVER 400 mg/dL   TRIG 960*   CHOLHDL 8.6   LDLDIRECT --   TELE: NSR, 60-70 bpm, trace PVCs, 1st degree block noted as high as 260 ms  ECG: NSR, 73 bpm, 1st degree AVB, early repolarization changes V4-V6, III, unchanged from prior tracings  Radiology/Studies:  Dg Chest 2 View  04/03/2012  *RADIOLOGY REPORT*  Clinical Data: Chest pain, shortness of breath.  CHEST - 2 VIEW  Comparison: 01/30/2010  Findings: Prominent spurring about both acromioclavicular joints. Flowing osteophytes at multiple contiguous levels in the mid thoracic spine. Lungs clear.  Heart size and pulmonary vascularity normal.  No effusion.  IMPRESSION: No acute disease   Original Report Authenticated By: D. Andria Rhein, MD     Current Medications:     . amLODipine  5 mg Oral Daily  .  aspirin  81 mg Oral Daily  . clopidogrel  75 mg Oral Q breakfast  . glimepiride  2 mg Oral Q breakfast  . hydrochlorothiazide  25 mg Oral Daily  . insulin aspart  0-15 Units Subcutaneous TID WC  . metoprolol tartrate  25 mg Oral BID  . sodium chloride  3 mL Intravenous Q12H  . sodium chloride  3 mL Intravenous Q12H    ASSESSMENT AND PLAN:  1. Unstable angina- initial POC TnI was very mildly elevated, however 3 subsequent sets of TnI were WNL. Non-diagnostic for MI. Cath yesterday revealed 75% distal RCA ISR s/p DES. 70% mid LCx unchanged from prior studies. Patent prior RCA, PLA stents. Subtotal mid-dis PDA occlusion. Diffuse nonobstructive disease elsewhere. LVEF 55-65%. Continue ASA/Plavix. Allergy to statins. Consider alternative antihyperlipidemic. BB has been continued despite mild increase in PR interval, had been  held previously for suspected erectile dysfunction, which was found to have weak correlation on further questioning. R radial access site healing well.   2. Hypertension- well-controlled. Continue CCB, BB, diuretic.   3. Dyslipidemia- TG 546, HDL 21, LDL unable to calculate, TC 181. Would benefit from alternative antihyperlipidemic- niacin, BAS or ezetimibe + fibrate or fish oil for hyperTGemia.   4. First degree AV block- PR 208 ms prior to starting BB. Has ranged from 216-260 ms since- seems to be prolonging. Asymptomatic with this. Would be benefit from cardiac perspective given diffuse CAD. Will discuss continuation with MD.    5. Hypokalemia- resolved yesterday after repleting. AM labs pending.   6. Thrombocytopenia- 129-142 during the admission. This is c/w prior results in 2011. H/H stable.  Dispo- ambulate this AM. If does well, discharge today. Follow-up 2-4 weeks.   Signed, R. Hurman Horn, PA-C 04/07/2012, 6:38 AM  Patient seen, examined. Available data reviewed. Agree with findings, assessment, and plan as outlined by Hurman Horn, PA-C. Exam reveals RRR, clear lung fields, no edema, and right radial site without ecchymoses or hematoma. Recommend d/c home today, ASA 81 mg and plavix 75 mg daily x at least 12 months. Continue metoprolol 25 mg BID. Agree trial of fenofibrate if patient is willing. Foolow-up with Dr Jens Som or PA/NP in office 2 weeks.  Tonny Bollman, M.D. 04/07/2012 10:35 AM

## 2012-04-07 NOTE — Plan of Care (Signed)
Problem: Food- and Nutrition-Related Knowledge Deficit (NB-1.1) Goal: Nutrition education Formal process to instruct or train a patient/client in a skill or to impart knowledge to help patients/clients voluntarily manage or modify food choices and eating behavior to maintain or improve health.  Outcome: Completed/Met Date Met:  04/07/12  RD consulted for nutrition education regarding diabetes.   Pt did not seem receptive to education. Reports that his daughter is a type I diabetic and feels that he knows what to do. Pt states that he is going to eat less when he gets home. Pt does cooking and shopping. Wife present but did not have questions and did not appear interested. Per RN pt refused to watch DM videos.  Encouraged pt to seek out RD at cardiac rehab for further education.     Lab Results  Component Value Date    HGBA1C 10.6* 04/04/2012    RD provided "Carbohydrate Counting for People with Diabetes" handout from the Academy of Nutrition and Dietetics. Discussed different food groups and their effects on blood sugar, emphasizing carbohydrate-containing foods. Provided list of carbohydrates and recommended serving sizes of common foods.  Discussed importance of controlled and consistent carbohydrate intake throughout the day. Provided examples of ways to balance meals/snacks and encouraged intake of high-fiber, whole grain complex carbohydrates. Teach back method used.  Expect fair compliance.  Body mass index is 34.77 kg/(m^2). Pt meets criteria for Obesity Class I based on current BMI.  Current diet order is CHO Modified Medium, patient is consuming approximately 100% of meals at this time. Labs and medications reviewed. No further nutrition interventions warranted at this time. RD contact information provided. If additional nutrition issues arise, please re-consult RD.  Kendell Bane RD, LDN, CNSC [phone] Pager [phone] After Hours Pager

## 2012-04-21 ENCOUNTER — Ambulatory Visit (INDEPENDENT_AMBULATORY_CARE_PROVIDER_SITE_OTHER): Payer: Medicare Other | Admitting: Physician Assistant

## 2012-04-21 ENCOUNTER — Telehealth: Payer: Self-pay | Admitting: *Deleted

## 2012-04-21 ENCOUNTER — Encounter: Payer: Self-pay | Admitting: Physician Assistant

## 2012-04-21 VITALS — BP 112/78 | HR 75 | Ht 72.0 in | Wt 256.2 lb

## 2012-04-21 DIAGNOSIS — I2510 Atherosclerotic heart disease of native coronary artery without angina pectoris: Secondary | ICD-10-CM

## 2012-04-21 DIAGNOSIS — E785 Hyperlipidemia, unspecified: Secondary | ICD-10-CM

## 2012-04-21 DIAGNOSIS — D696 Thrombocytopenia, unspecified: Secondary | ICD-10-CM

## 2012-04-21 DIAGNOSIS — E119 Type 2 diabetes mellitus without complications: Secondary | ICD-10-CM

## 2012-04-21 DIAGNOSIS — I10 Essential (primary) hypertension: Secondary | ICD-10-CM

## 2012-04-21 LAB — CBC WITH DIFFERENTIAL/PLATELET
Basophils Relative: 0.4 % (ref 0.0–3.0)
Eosinophils Relative: 2 % (ref 0.0–5.0)
HCT: 41.2 % (ref 39.0–52.0)
Hemoglobin: 14.2 g/dL (ref 13.0–17.0)
MCHC: 34.4 g/dL (ref 30.0–36.0)
MCV: 86.8 fl (ref 78.0–100.0)
Monocytes Absolute: 0.5 10*3/uL (ref 0.1–1.0)
Neutro Abs: 2.4 10*3/uL (ref 1.4–7.7)
Neutrophils Relative %: 54.3 % (ref 43.0–77.0)
RBC: 4.74 Mil/uL (ref 4.22–5.81)
WBC: 4.5 10*3/uL (ref 4.5–10.5)

## 2012-04-21 LAB — BASIC METABOLIC PANEL
CO2: 31 mEq/L (ref 19–32)
Chloride: 101 mEq/L (ref 96–112)
Creatinine, Ser: 1.4 mg/dL (ref 0.4–1.5)
Potassium: 3.5 mEq/L (ref 3.5–5.1)
Sodium: 138 mEq/L (ref 135–145)

## 2012-04-21 NOTE — Telephone Encounter (Signed)
lmom labs ok 

## 2012-04-21 NOTE — Patient Instructions (Addendum)
LABS TODAY; BMET, CBC W/DIFF  06/15/12 FASTING LIPID AND LIVER PANEL LAB OPENS 7:30 AM  YOU HAVE BEEN GIVEN AN RX FOR A GLUCOSE METER AND STRIPS, PLEASE ARRANGE FOR A PRIMARY CARE PHYSICIAN TO FOLLOW UP ON YOUR DIABETES  FOLLOW UP WITH DR. CRENSHAW IN 3 MONTHS

## 2012-04-21 NOTE — Telephone Encounter (Signed)
Message copied by Tarri Fuller on Tue Apr 21, 2012  5:07 PM ------      Message from: [place], [place] T      Created: Tue Apr 21, 2012  4:58 PM       Labs ok.      Continue with current treatment plan.      Tereso Newcomer, PA-C  4:58 PM 04/21/2012

## 2012-04-21 NOTE — Progress Notes (Signed)
[address], [address] [place], [place]  [address] Phone: [phone], Fax:  [phone]  Date:  04/21/2012   Name:  [name].   DOB:  1945/09/22   MRN:  [id number]  PCP:  Provider Not In System  Primary Cardiologist:  Dr. Olga Millers  Primary Electrophysiologist:  None    History of Present Illness: [name]. is a 67 y.o. male who returns for followup after recent admission to the hospital for unstable angina.  He has a hx of CAD, s/p PCI to the RCA in 2002, s/p NSTEMI 10/11 treated with a BMS to the distal RCA and DES to the PL2, HTN, HL, thrombocytopenia.  Abdominal U/S 12/13:  No AAA.    He was admitted 12/27-12/31. He presented with progressively worsening dyspnea with exertion and new onset substernal chest pain. Cardiac markers were abnormal initially but subsequent troponins were normal. Echo 04/04/12: Mild LVH, EF 55-60%, normal wall motion, mildly calcified aortic valve annulus, mild RVE.  Patient was set up for cardiac catheterization. LHC 04/06/12: mLAD 50%, proximal D2 60%, mCFX 70% (unchanged from previous study), mRCA 30-40%, dRCA stent patent followed by an area of moderately severe ISR at 75%, PLA stent patent, EF 55-65%.  PCI:  Promus Premier DES to dRCA ISR.  DAPT recommended x 1 year.  Patient was placed on metformin for newly dx diabetes. Hemoglobin A1c was 10.6. Patient has been intolerant to statins and he was started on fenofibrate.  Since d/c, he is doing well.  The patient denies chest pain, shortness of breath, syncope, orthopnea, PND or significant pedal edema.   Labs (12/13):   K 3.8, creatinine 1.10, ALT 25, Trigs 546, Hgb 14, PLT 129K->153K  Wt Readings from Last 3 Encounters:  04/07/12 256 lb 6.3 oz (116.3 kg)  04/07/12 256 lb 6.3 oz (116.3 kg)  04/07/12 256 lb 6.3 oz (116.3 kg)     Past Medical History  Diagnosis Date  . CAD (coronary artery disease)     a. s/p PCI to RCA 2002;  b. NSTEMI 10/11: cath with pLAD 30%,  mLAD 30-40%, pCFX 20%, mCFX 60%, dCFX occluded (med Rx); pRCA 20%, dRCA 95% before stent tx with BMS, RCA stent ok, m-d PDA 90-95% (med Rx); pPL2 80% tx with DES; EF 55% c.  75% distal RCA ISR s/p DES. 70% mid LCx unchanged from prior studies. Patent prior RCA, PLA stents. Subtotal mid-dis PDA occlusion. EF  55-60%.   . Hyperlipidemia     intolerant to statins => fenofibrate started 1/14 (Trigs > 400)  . Hypertension   . TIA (transient ischemic attack) 2002  . Type 2 diabetes mellitus     dx 04/2012 => metformin started  . Hx of echocardiogram     a. Echo 04/04/12: Mild LVH, EF 55-60%, normal wall motion, mildly calcified aortic valve annulus, mild RVE.    Current Outpatient Prescriptions  Medication Sig Dispense Refill  . amLODipine (NORVASC) 5 MG tablet Take 1 tablet (5 mg total) by mouth daily.  90 tablet  4  . aspirin 81 MG chewable tablet Chew 1 tablet (81 mg total) by mouth daily.      . clopidogrel (PLAVIX) 75 MG tablet Take 1 tablet (75 mg total) by mouth daily with breakfast.  30 tablet  3  . fenofibrate (TRICOR) 145 MG tablet Take 1 tablet (145 mg total) by mouth daily.  30 tablet  3  . hydrochlorothiazide (HYDRODIURIL) 25 MG tablet Take 1 tablet (  25 mg total) by mouth daily.  30 tablet  8  . metFORMIN (GLUCOPHAGE) 500 MG tablet Take 1 tablet (500 mg total) by mouth 2 (two) times daily with a meal.  60 tablet  3  . metoprolol tartrate (LOPRESSOR) 25 MG tablet Take 1 tablet (25 mg total) by mouth 2 (two) times daily.  60 tablet  3  . nitroGLYCERIN (NITROSTAT) 0.4 MG SL tablet Place 1 tablet (0.4 mg total) under the tongue every 5 (five) minutes as needed.  25 tablet  12    Allergies: Allergies  Allergen Reactions  . Statins Other (See Comments)    Myalgias with 3 different statins    Social History:  The patient  reports that he has never smoked. He has never used smokeless tobacco. He reports that he does not drink alcohol or use illicit drugs.   ROS:  Please see the history  of present illness.      All other systems reviewed and negative.   PHYSICAL EXAM: VS:  BP 112/78  Pulse 75  Ht 6' (1.829 m)  Wt 256 lb 3.2 oz (116.212 kg)  BMI 34.75 kg/m2 Well nourished, well developed, in no acute distress HEENT: normal Neck: no JVD Cardiac:  normal S1, S2; RRR; no murmur Lungs:  clear to auscultation bilaterally, no wheezing, rhonchi or rales Abd: soft, nontender, no hepatomegaly Ext: no edema; right wrist without hematoma or bruit  Skin: warm and dry Neuro:  CNs 2-12 intact, no focal abnormalities noted  EKG:  NSR, HR 75, normal axis, NSSTTW changes, 1st degree AVB, PR 218 ms     ASSESSMENT AND PLAN:  1. Coronary Artery Disease:   Doing well s/p PCI.  We discussed the importance of dual antiplatelet therapy.  He is getting enrolled in cardiac rehab in [employer].   2. Diabetes Mellitus:  He is on metformin.  Check BMET to follow up on renal fxn and K+.  He plans to establish with a PCP in [place].  I gave him a Rx for a glucometer and test strips. 3. Hypertension:  Controlled.  Continue current therapy.  4. Hyperlipidemia:  Continue Fenofibrate.  Check L/L 8 weeks. 5. Thrombocytopenia:  Mild in the past.  Check f/u CBC. 6. Disposition:  Follow up with Dr. Olga Millers.   Signed, Tereso Newcomer, PA-C  2:21 PM 04/21/2012

## 2012-06-25 ENCOUNTER — Other Ambulatory Visit: Payer: Medicare Other

## 2012-06-25 ENCOUNTER — Ambulatory Visit: Payer: Medicare Other | Admitting: Cardiology

## 2012-08-05 ENCOUNTER — Other Ambulatory Visit: Payer: Self-pay | Admitting: *Deleted

## 2012-08-05 MED ORDER — FENOFIBRATE 145 MG PO TABS: 145.0000 mg | ORAL_TABLET | Freq: Every day | ORAL | Status: DC

## 2012-08-05 MED ORDER — METOPROLOL TARTRATE 25 MG PO TABS: 25.0000 mg | ORAL_TABLET | Freq: Two times a day (BID) | ORAL | Status: DC

## 2012-08-05 MED ORDER — AMLODIPINE BESYLATE 5 MG PO TABS: 5.0000 mg | ORAL_TABLET | Freq: Every day | ORAL | Status: DC

## 2012-08-05 MED ORDER — CLOPIDOGREL BISULFATE 75 MG PO TABS: 75.0000 mg | ORAL_TABLET | Freq: Every day | ORAL | Status: DC

## 2012-08-05 MED ORDER — HYDROCHLOROTHIAZIDE 25 MG PO TABS: 25.0000 mg | ORAL_TABLET | Freq: Every day | ORAL | Status: DC

## 2012-08-12 ENCOUNTER — Other Ambulatory Visit: Payer: Self-pay | Admitting: *Deleted

## 2012-08-12 ENCOUNTER — Telehealth: Payer: Self-pay | Admitting: *Deleted

## 2012-08-12 DIAGNOSIS — I10 Essential (primary) hypertension: Secondary | ICD-10-CM

## 2012-08-12 MED ORDER — METFORMIN HCL 500 MG PO TABS: 500.0000 mg | ORAL_TABLET | Freq: Two times a day (BID) | ORAL | Status: DC

## 2012-08-12 NOTE — Telephone Encounter (Signed)
REFERRAL IN COMPUTER FOR PCP

## 2012-08-12 NOTE — Telephone Encounter (Signed)
Patient's wife left message on refill line to see if Dr Jens Som or Tereso Newcomer can refill her husband's Metformin. She said all his other rx were filled but nit his metformin. It was given to him when he was last in the hospital and has ran out of medication. He does not have a doctor that follows his diabetes yet but needs a refill until they can find him a doctor to do so. I will forward this message to both Danielle Rankin and Deliah Goody and return the patient's call to let them know about needing further approval before refilling Metformin.     Unable to reach [facility]  Lovelle Deitrick CMA

## 2012-10-02 ENCOUNTER — Other Ambulatory Visit: Payer: Self-pay | Admitting: Cardiology

## 2012-12-03 ENCOUNTER — Other Ambulatory Visit: Payer: Self-pay | Admitting: Cardiology

## 2012-12-14 ENCOUNTER — Telehealth: Payer: Self-pay | Admitting: Cardiology

## 2012-12-14 NOTE — Telephone Encounter (Signed)
Pt calling to request refill of metformin at [facility]

## 2012-12-14 NOTE — Telephone Encounter (Signed)
Patient is asking for refill for metformin, I have left phone message with patient regarding refill, I have ask if he has found a PCP to manage his diabetes and refill his medications as stated in prior note.    F/u notified patient metformin will need to be filled by PCP

## 2012-12-15 ENCOUNTER — Telehealth: Payer: Self-pay | Admitting: Endocrinology

## 2012-12-15 NOTE — Telephone Encounter (Signed)
[phone], out of Metformin 500mg . Has not seen Dr. Lucianne Muss yet. Started on Metformin by Dr. Jens Som in the hosp but he will not refill. Pt says he needs to be seen today or called in a refill. No openings for an appt. / Roanna Raider

## 2012-12-15 NOTE — Telephone Encounter (Signed)
Metformin should be filled by primary care [name]

## 2012-12-15 NOTE — Telephone Encounter (Signed)
[phone], out of Metformin 500mg . Has not seen Dr. Lucianne Muss yet. Started on Metformin by Dr. Jens Som in the hosp but he will not refill. Pt says he needs to be seen today or called in a refill. No openings for an appt. / Sherri  Please advise

## 2012-12-15 NOTE — Telephone Encounter (Signed)
This cannot be prescribed without the kidney function test, does he have PCP? Please have him scheduled with me or Dr. Lafe Garin ASAP

## 2012-12-17 ENCOUNTER — Encounter: Payer: Self-pay | Admitting: Endocrinology

## 2012-12-17 ENCOUNTER — Ambulatory Visit (INDEPENDENT_AMBULATORY_CARE_PROVIDER_SITE_OTHER): Payer: Medicare Other | Admitting: Endocrinology

## 2012-12-17 VITALS — BP 122/78 | HR 78 | Temp 97.9°F | Ht 70.18 in | Wt 251.6 lb

## 2012-12-17 DIAGNOSIS — E785 Hyperlipidemia, unspecified: Secondary | ICD-10-CM

## 2012-12-17 DIAGNOSIS — E876 Hypokalemia: Secondary | ICD-10-CM

## 2012-12-17 DIAGNOSIS — E119 Type 2 diabetes mellitus without complications: Secondary | ICD-10-CM

## 2012-12-17 LAB — URINALYSIS, ROUTINE W REFLEX MICROSCOPIC
Bilirubin Urine: NEGATIVE
Hgb urine dipstick: NEGATIVE
Leukocytes, UA: NEGATIVE
Nitrite: NEGATIVE
pH: 6 (ref 5.0–8.0)

## 2012-12-17 LAB — COMPREHENSIVE METABOLIC PANEL
Alkaline Phosphatase: 56 U/L (ref 39–117)
CO2: 29 mEq/L (ref 19–32)
Creatinine, Ser: 1.1 mg/dL (ref 0.4–1.5)
GFR: 70.61 mL/min (ref >60.00)
Glucose, Bld: 107 mg/dL — ABNORMAL HIGH (ref 70–99)
Total Bilirubin: 0.8 mg/dL (ref 0.3–1.2)

## 2012-12-17 LAB — MICROALBUMIN / CREATININE URINE RATIO: Microalb Creat Ratio: 1.3 mg/g (ref 0.0–30.0)

## 2012-12-17 LAB — HEMOGLOBIN A1C: Hgb A1c MFr Bld: 6.8 % — ABNORMAL HIGH (ref 4.6–6.5)

## 2012-12-17 NOTE — Patient Instructions (Addendum)
Treatment to be decided after labs back  Cut HCTZ in 1/2

## 2012-12-17 NOTE — Progress Notes (Signed)
Patient ID: [name]., male   DOB: Dec 31, 1945, 67 y.o.   MRN: [id number]    Reason for Appointment : Consultation for Type 2 Diabetes  History of Present Illness          Diagnosis: Type 2 diabetes mellitus, date of diagnosis: 12/13         Past history: He was diagnosed incidentally when he was admitted for chest pain in 12/13 At that time he was discharged on metformin 500 mg twice a day but did not have any followup done Review of his chart indicates that he had a random glucose of 189 in 2011 Also did not have any diabetes education, meal planning or other advice given. Has not received a glucose monitor for testing However on his own he has been trying to modify his diet lower calories and has been able to lose 20 pounds  Recent history: He has not been seen for followup and has not been instructed on home glucose monitoring Does not complain of any increased thirst, urination or blurred vision, no unusual fatigue. He is usually trying to follow a heart healthy diet and drinks mostly water or unsweetened beverages  Glucose monitoring: Glucometer:  none      Self-care: The diet that the patient has been following is: No particular diet    Meals: 3 meals per day.          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          Dietician visit:  never               Oral hypoglycemic drugs the patient is taking are metformin 500 mg twice a day, none for the last 3-4 days      Side effects from medications have been: None  Retinal exam: Most recent:.Several years ago     Lab Results  Component Value Date   HGBA1C 10.6* 04/04/2012    Filed Weights   12/17/12 1459  Weight: 251 lb 9.6 oz (114.125 kg)      Medication List       This list is accurate as of: 12/17/12  3:03 PM.  Always use your most recent med list.               amLODipine 5 MG tablet  Commonly known as:  NORVASC  Take 1 tablet (5 mg total) by mouth  daily.     aspirin 81 MG chewable tablet  Chew 1 tablet (81 mg total) by mouth daily.     clopidogrel 75 MG tablet  Commonly known as:  PLAVIX  TAKE 1 TABLET BY MOUTH DAILY WITH BREAKFAST     fenofibrate 145 MG tablet  Commonly known as:  TRICOR  Take 1 tablet (145 mg total) by mouth daily.     hydrochlorothiazide 25 MG tablet  Commonly known as:  HYDRODIURIL  TAKE 1 TABLET BY MOUTH ONCE DAILY     metFORMIN 500 MG tablet  Commonly known as:  GLUCOPHAGE  Take 1 tablet (500 mg total) by mouth 2 (two) times daily with a meal.     metoprolol tartrate 25 MG tablet  Commonly known as:  LOPRESSOR  TAKE 1 TABLET BY MOUTH 2 TIMES DAILY     nitroGLYCERIN 0.4 MG SL tablet  Commonly known as:  NITROSTAT  Place 1 tablet (0.4 mg total) under the tongue every 5 (five) minutes as needed.  Allergies:  Allergies  Allergen Reactions  . Statins Other (See Comments)    Myalgias with 3 different statins    Past Medical History  Diagnosis Date  . CAD (coronary artery disease)     a. s/p PCI to RCA 2002;  b. NSTEMI 10/11: cath with pLAD 30%, mLAD 30-40%, pCFX 20%, mCFX 60%, dCFX occluded (med Rx); pRCA 20%, dRCA 95% before stent tx with BMS, RCA stent ok, m-d PDA 90-95% (med Rx); pPL2 80% tx with DES; EF 55% c.  75% distal RCA ISR s/p DES. 70% mid LCx unchanged from prior studies. Patent prior RCA, PLA stents. Subtotal mid-dis PDA occlusion. EF  55-60%.   . Hyperlipidemia     intolerant to statins => fenofibrate started 1/14 (Trigs > 400)  . Hypertension   . TIA (transient ischemic attack) 2002  . Type 2 diabetes mellitus     dx 04/2012 => metformin started  . Hx of echocardiogram     a. Echo 04/04/12: Mild LVH, EF 55-60%, normal wall motion, mildly calcified aortic valve annulus, mild RVE.    Past Surgical History  Procedure Laterality Date  . Hernia repair  2007    [facility]  . Hernia repair  02/04/11    BIH repair   . Coronary angioplasty with stent placement  2011    Dist RCA  4.0 x 15 mm BMS, PL 2.5 x 12 mm ION DES  . Coronary angioplasty  03/2012    75% distal RCA ISR s/p DES. 70% mid LCx unchanged from prior studies. Patent prior RCA, PLA stents. Subtotal mid-dis PDA occlusion. Diffuse nonobstructive disease elsewhere. LVEF 55-65%    Family History  Problem Relation Age of Onset  . Heart disease Mother     Social History:  reports that he has never smoked. He has never used smokeless tobacco. He reports that he does not drink alcohol or use illicit drugs.    Review of Systems       Lipids: He has been treated with 3 types of statins but apparently because of muscle aches he could not tolerate any of them and is taking fenofibrate,        Known history of CAD         Skin: No rash or infections     Thyroid:  No  unusual fatigue.     The blood pressure has been high for several years and has been managed by his cardiologist, currently taking 3 drugs     No swelling of feet.     No shortness of breath on exertion.     Bowel habits:  Normal       No frequency of urination or nocturia       No joint  pains.         No history of Numbness, tingling or burning in  feet. His feet feel cold but this is not new     Physical Examination:  BP 122/78  Pulse 78  Temp(Src) 97.9 F (36.6 C) (Oral)  Ht 5' 10.18" (1.783 m)  Wt 251 lb 9.6 oz (114.125 kg)  BMI 35.9 kg/m2  SpO2 94%  GENERAL:         Patient appears to have generalized obesity.   HEENT:         Eye exam shows normal external appearance. Fundus exam shows no retinopathy. Oral exam shows normal mucosa .  NECK:         General:  Neck exam shows no lymphadenopathy.  Carotids are normal to palpation and no bruit heard. Thyroid is not enlarged and no nodules felt.   LUNGS:         Chest is symmetrical. Lungs are clear to auscultation.Marland Kitchen   HEART:         Heart sounds:  S1 and S2 are normal. No murmurs or clicks heard., no S3 or S4.   ABDOMEN:         General:  There is no distention present.  Liver and spleen are not palpable. No other mass or tenderness present.  EXTREMITIES:     There is no edema. No skin lesions present.Marland Kitchen  NEUROLOGICAL:        Vibration sense is markedly reduced in toes. Ankle jerks are 1+ on the left and absent on the right  MUSCULOSKELETAL:       There is no enlargement or deformity of the joints. Spine is normal to inspection.Marland Kitchen   PEDAL pulses: Normal SKIN:       No rash or lesions of concern.        ASSESSMENT:  Diabetes type 2, incidentally diagnosed in 12/13  Although he had fairly significant diabetes at the onset and A1c was over 10% he appears to have no symptoms of hyperglycemia now Noncompliance with followup Today blood glucose is fairly good even with running out of his metformin recently Will check his A1c and glucose today and decide on further treatment He does need comprehensive diabetes education and will be started on home glucose monitoring Discussed basic principles of meal planning, importance of regular exercise, blood sugar targets, timing of glucose monitoring and effects of metformin and glucose  Complications: None on today's assessment, need to check urine microalbumin  HYPERTENSION: This is very well controlled. Advised him to cut back on HCTZ to 12.5 mg to minimize effects on electrolyte imbalance, lipids and insulin sensitivity. Consider using ACE inhibitor or ARB drug instead of amlodipine if creatinine normal  HYPERLIPIDEMIA: he will need  to have fasting lipids reassessed, consider using Livalo or pravastatin     Anusha Claus 12/17/2012, 3:03 PM   Addendum: A1c 6.8, to resume metformin and use 1500 mg a day, patient informed Potassium 3.4, should improve with reducing the 25 mg HCTZ  Office Visit on 12/17/2012  Component Date Value Range Status  . POC Glucose 12/17/2012 123* 70 - 99 mg/dl Final   Before meal  . Hemoglobin A1C 12/17/2012 6.8* 4.6 - 6.5 % Final   Glycemic Control Guidelines for People with Diabetes:Non  Diabetic:  <6%Goal of Therapy: <7%Additional Action Suggested:  >8%   . Microalb, Ur 12/17/2012 2.3* 0.0 - 1.9 mg/dL Final  . Creatinine,U 78/29/5621 172.6   Final  . Microalb Creat Ratio 12/17/2012 1.3  0.0 - 30.0 mg/g Final  . Color, Urine 12/17/2012 LT. YELLOW  Yellow;Lt. Yellow Final  . APPearance 12/17/2012 CLEAR  Clear Final  . Specific Gravity, Urine 12/17/2012 >=1.030  1.000 - 1.030 Final  . pH 12/17/2012 6.0  5.0 - 8.0 Final  . Total Protein, Urine 12/17/2012 NEGATIVE  Negative Final  . Urine Glucose 12/17/2012 NEGATIVE  Negative Final  . Ketones, ur 12/17/2012 NEGATIVE  Negative Final  . Bilirubin Urine 12/17/2012 NEGATIVE  Negative Final  . Hgb urine dipstick 12/17/2012 NEGATIVE  Negative Final  . Urobilinogen, UA 12/17/2012 0.2  0.0 - 1.0 Final  . Leukocytes, UA 12/17/2012 NEGATIVE  Negative Final  . Nitrite 12/17/2012 NEGATIVE  Negative Final  . WBC, UA 12/17/2012 0-2/hpf  0-2/hpf Final  .  Mucus, UA 12/17/2012 Presence of  None Final  . Squamous Epithelial / LPF 12/17/2012 Rare(0-4/hpf)  Rare(0-4/hpf) Final  . Sodium 12/17/2012 140  135 - 145 mEq/L Final  . Potassium 12/17/2012 3.4* 3.5 - 5.1 mEq/L Final  . Chloride 12/17/2012 103  96 - 112 mEq/L Final  . CO2 12/17/2012 29  19 - 32 mEq/L Final  . Glucose, Bld 12/17/2012 107* 70 - 99 mg/dL Final  . BUN 21/30/8657 17  6 - 23 mg/dL Final  . Creatinine, Ser 12/17/2012 1.1  0.4 - 1.5 mg/dL Final  . Total Bilirubin 12/17/2012 0.8  0.3 - 1.2 mg/dL Final  . Alkaline Phosphatase 12/17/2012 56  39 - 117 U/L Final  . AST 12/17/2012 20  0 - 37 U/L Final  . ALT 12/17/2012 21  0 - 53 U/L Final  . Total Protein 12/17/2012 7.3  6.0 - 8.3 g/dL Final  . Albumin 84/69/6295 4.3  3.5 - 5.2 g/dL Final  . Calcium 28/41/3244 9.0  8.4 - 10.5 mg/dL Final  . GFR 04/10/7251 70.61  >60.00 mL/min Final

## 2012-12-18 ENCOUNTER — Telehealth: Payer: Self-pay | Admitting: Endocrinology

## 2012-12-18 NOTE — Telephone Encounter (Signed)
Blood sugar is still somewhat high but much better than last year, to continue metformin but change the dose to 1000 mg, half tablet in the morning and one in the evening, followup in 6 weeks, he is going to be scheduled for diabetes education and instructions on home monitoring

## 2012-12-23 ENCOUNTER — Other Ambulatory Visit: Payer: Self-pay | Admitting: *Deleted

## 2012-12-23 MED ORDER — METFORMIN HCL 1000 MG PO TABS: ORAL_TABLET | ORAL | Status: DC

## 2012-12-23 NOTE — Telephone Encounter (Signed)
Results given to patients wife. 

## 2013-01-11 ENCOUNTER — Encounter: Payer: Self-pay | Admitting: Cardiology

## 2013-01-11 ENCOUNTER — Ambulatory Visit (INDEPENDENT_AMBULATORY_CARE_PROVIDER_SITE_OTHER): Payer: Medicare Other | Admitting: Cardiology

## 2013-01-11 ENCOUNTER — Ambulatory Visit: Payer: Medicare Other | Admitting: Endocrinology

## 2013-01-11 VITALS — BP 120/80 | HR 62 | Ht 70.5 in | Wt 256.0 lb

## 2013-01-11 DIAGNOSIS — E785 Hyperlipidemia, unspecified: Secondary | ICD-10-CM

## 2013-01-11 DIAGNOSIS — I2510 Atherosclerotic heart disease of native coronary artery without angina pectoris: Secondary | ICD-10-CM

## 2013-01-11 DIAGNOSIS — I10 Essential (primary) hypertension: Secondary | ICD-10-CM

## 2013-01-11 LAB — BASIC METABOLIC PANEL
CO2: 28 mEq/L (ref 19–32)
Chloride: 101 mEq/L (ref 96–112)
Potassium: 3.6 mEq/L (ref 3.5–5.1)
Sodium: 135 mEq/L (ref 135–145)

## 2013-01-11 NOTE — Assessment & Plan Note (Signed)
Intolerant to statins. Continue diet. 

## 2013-01-11 NOTE — Progress Notes (Signed)
HPI: FU CAD; s/p PCI to the RCA in 2002, s/p NSTEMI 10/11 treated with a BMS to the distal RCA and DES to the PL2. Abdominal U/S 12/13: No AAA. Admitted 12/13 with UA. Echo 04/04/12: Mild LVH, EF 55-60%, normal wall motion, mildly calcified aortic valve annulus, mild RVE. LHC 04/06/12: mLAD 50%, proximal D2 60%, mCFX 70% (unchanged from previous study), mRCA 30-40%, dRCA stent patent followed by an area of moderately severe ISR at 75%, PLA stent patent, EF 55-65%. PCI: Promus Premier DES to dRCA ISR. DAPT recommended x 1 year. Patient has been intolerant to statins. Last seen in January of 2014. Since that time, the patient has dyspnea with more extreme activities but not with routine activities. It is relieved with rest. It is not associated with chest pain. There is no orthopnea, PND or pedal edema. There is no syncope or palpitations. There is no exertional chest pain.     Current Outpatient Prescriptions  Medication Sig Dispense Refill  . amLODipine (NORVASC) 5 MG tablet Take 1 tablet (5 mg total) by mouth daily.  90 tablet  4  . aspirin 81 MG chewable tablet Chew 1 tablet (81 mg total) by mouth daily.      . clopidogrel (PLAVIX) 75 MG tablet TAKE 1 TABLET BY MOUTH DAILY WITH BREAKFAST  30 tablet  1  . hydrochlorothiazide (HYDRODIURIL) 25 MG tablet TAKE 1 TABLET BY MOUTH ONCE DAILY  30 tablet  3  . metFORMIN (GLUCOPHAGE) 1000 MG tablet Take 1/2 tablet with breakfast and 1/2 tablet with evening meal  30 tablet  5  . metoprolol tartrate (LOPRESSOR) 25 MG tablet TAKE 1 TABLET BY MOUTH 2 TIMES DAILY  60 tablet  1  . nitroGLYCERIN (NITROSTAT) 0.4 MG SL tablet Place 1 tablet (0.4 mg total) under the tongue every 5 (five) minutes as needed.  25 tablet  12  . omega-3 acid ethyl esters (LOVAZA) 1 G capsule Take 1 g by mouth 2 (two) times daily.       No current facility-administered medications for this visit.     Past Medical History  Diagnosis Date  . CAD (coronary artery disease)     a.  s/p PCI to RCA 2002;  b. NSTEMI 10/11: cath with pLAD 30%, mLAD 30-40%, pCFX 20%, mCFX 60%, dCFX occluded (med Rx); pRCA 20%, dRCA 95% before stent tx with BMS, RCA stent ok, m-d PDA 90-95% (med Rx); pPL2 80% tx with DES; EF 55% c.  75% distal RCA ISR s/p DES. 70% mid LCx unchanged from prior studies. Patent prior RCA, PLA stents. Subtotal mid-dis PDA occlusion. EF  55-60%.   . Hyperlipidemia     intolerant to statins => fenofibrate started 1/14 (Trigs > 400)  . Hypertension   . TIA (transient ischemic attack) 2002  . Type 2 diabetes mellitus     dx 04/2012 => metformin started  . Hx of echocardiogram     a. Echo 04/04/12: Mild LVH, EF 55-60%, normal wall motion, mildly calcified aortic valve annulus, mild RVE.    Past Surgical History  Procedure Laterality Date  . Hernia repair  2007    [facility]  . Hernia repair  02/04/11    BIH repair   . Coronary angioplasty with stent placement  2011    Dist RCA 4.0 x 15 mm BMS, PL 2.5 x 12 mm ION DES  . Coronary angioplasty  03/2012    75% distal RCA ISR s/p DES. 70% mid LCx unchanged from  prior studies. Patent prior RCA, PLA stents. Subtotal mid-dis PDA occlusion. Diffuse nonobstructive disease elsewhere. LVEF 55-65%    History   Social History  . Marital Status: Married    Spouse Name: N/A    Number of Children: N/A  . Years of Education: N/A   Occupational History  . Works on [employer], retail    Social History Main Topics  . Smoking status: Never Smoker   . Smokeless tobacco: Never Used  . Alcohol Use: No  . Drug Use: No  . Sexual Activity:    Other Topics Concern  . Not on file   Social History Narrative   Lives with wife, is active around the house and yard. Still works. Admits he does not eat healthy food.    ROS: no fevers or chills, productive cough, hemoptysis, dysphasia, odynophagia, melena, hematochezia, dysuria, hematuria, rash, seizure activity, orthopnea, PND, pedal edema, claudication. Remaining systems are  negative.  Physical Exam: Well-developed well-nourished in no acute distress.  Skin is warm and dry.  HEENT is normal.  Neck is supple.  Chest is clear to auscultation with normal expansion.  Cardiovascular exam is regular rate and rhythm.  Abdominal exam nontender or distended. No masses palpated. Extremities show no edema. neuro grossly intact  ECG sinus rhythm at a rate of 62. First-degree AV block.

## 2013-01-11 NOTE — Assessment & Plan Note (Signed)
Blood pressure controlled. Continue present medications. Check potassium and renal function. 

## 2013-01-11 NOTE — Assessment & Plan Note (Signed)
Continue aspirin. Discontinue Plavix in January of 2015. Patient intolerant to statins.

## 2013-01-11 NOTE — Patient Instructions (Addendum)
Your physician wants you to follow-up in:  12 months.  You will receive a reminder letter in the mail two months in advance. If you don't receive a letter, please call our office to schedule the follow-up appointment.  Your physician has recommended you make the following change in your medication Stop Plavix (clopidogrel) in January 2015

## 2013-01-22 ENCOUNTER — Encounter: Payer: Self-pay | Admitting: Endocrinology

## 2013-01-22 ENCOUNTER — Other Ambulatory Visit: Payer: Self-pay | Admitting: *Deleted

## 2013-01-22 ENCOUNTER — Ambulatory Visit (INDEPENDENT_AMBULATORY_CARE_PROVIDER_SITE_OTHER): Payer: Medicare Other | Admitting: Endocrinology

## 2013-01-22 VITALS — BP 128/70 | HR 64 | Temp 98.6°F | Resp 12 | Ht 71.0 in | Wt 260.9 lb

## 2013-01-22 DIAGNOSIS — I10 Essential (primary) hypertension: Secondary | ICD-10-CM

## 2013-01-22 DIAGNOSIS — E669 Obesity, unspecified: Secondary | ICD-10-CM

## 2013-01-22 DIAGNOSIS — E785 Hyperlipidemia, unspecified: Secondary | ICD-10-CM

## 2013-01-22 DIAGNOSIS — E119 Type 2 diabetes mellitus without complications: Secondary | ICD-10-CM

## 2013-01-22 LAB — LIPID PANEL
Cholesterol: 158 mg/dL (ref 0–200)
HDL: 24.6 mg/dL — ABNORMAL LOW (ref >39.00)
Total CHOL/HDL Ratio: 6
Triglycerides: 346 mg/dL — ABNORMAL HIGH (ref 0.0–149.0)
VLDL: 69.2 mg/dL — ABNORMAL HIGH (ref 0.0–40.0)

## 2013-01-22 LAB — COMPREHENSIVE METABOLIC PANEL
ALT: 20 U/L (ref 0–53)
AST: 20 U/L (ref 0–37)
Albumin: 4 g/dL (ref 3.5–5.2)
Alkaline Phosphatase: 58 U/L (ref 39–117)
BUN: 18 mg/dL (ref 6–23)
Creatinine, Ser: 1.2 mg/dL (ref 0.4–1.5)
Potassium: 3.7 mEq/L (ref 3.5–5.1)
Total Bilirubin: 0.4 mg/dL (ref 0.3–1.2)

## 2013-01-22 LAB — LDL CHOLESTEROL, DIRECT: Direct LDL: 83.8 mg/dL

## 2013-01-22 MED ORDER — PRAVASTATIN SODIUM 10 MG PO TABS: 10.0000 mg | ORAL_TABLET | Freq: Every day | ORAL | Status: DC

## 2013-01-22 MED ORDER — METFORMIN HCL 1000 MG PO TABS: ORAL_TABLET | ORAL | Status: DC

## 2013-01-22 NOTE — Progress Notes (Addendum)
Patient ID: Andrew Elwood., male   DOB: 02/02/1946, 67 y.o.   MRN: [id number]    Reason for Appointment : Followup of Type 2 Diabetes  History of Present Illness          Diagnosis: Type 2 diabetes mellitus, date of diagnosis: 12/13         Past history: He was diagnosed incidentally when he was admitted for chest pain in 12/13 At that time he was discharged on metformin 500 mg twice a day but did not have any followup done Review of his chart indicates that he had a random glucose of 189 in 2011 Also did not have any diabetes education, meal planning or given a glucose monitor.  However on his own he had been trying to modify his diet lower calories and has been able to lose 20 pounds  Recent history: His baseline A1c last month was 6.8% He was told to start metformin before his  last visit and he has started this without any side effects He is usually trying to follow a heart healthy diet and drinks mostly water or unsweetened beverages However at breakfast he is eating mostly carbohydrate rich meal without any protein He has gained weight since his last visit  Glucose monitoring: Glucometer:  none      Self-care: The diet that the patient has been following is: No particular diet    Meals: 3 meals per day.          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          Dietician visit:  never               Oral hypoglycemic drugs the patient is taking are metformin 500 mg twice a day     Side effects from medications have been: None  Retinal exam: Most recent:.Several years ago     Lab Results  Component Value Date   HGBA1C 6.8* 12/17/2012    Wt Readings from Last 3 Encounters:  01/22/13 260 lb 14.4 oz (118.343 kg)  01/11/13 256 lb (116.121 kg)  12/17/12 251 lb 9.6 oz (114.125 kg)       Medication List       This list is accurate as of: 01/22/13  8:25 AM.  Always use your most recent med list.               amLODipine 5 MG tablet  Commonly known as:  NORVASC  Take 1 tablet (5 mg total) by mouth daily.     aspirin 81 MG chewable tablet  Chew 1 tablet (81 mg total) by mouth daily.     clopidogrel 75 MG tablet  Commonly known as:  PLAVIX  TAKE 1 TABLET BY MOUTH DAILY WITH BREAKFAST     hydrochlorothiazide 25 MG tablet  Commonly known as:  HYDRODIURIL  TAKE 1 TABLET BY MOUTH ONCE DAILY     metFORMIN 1000 MG tablet  Commonly known as:  GLUCOPHAGE  Take 1/2 tablet with breakfast and 1/2 tablet with evening meal     metoprolol tartrate 25 MG tablet  Commonly known as:  LOPRESSOR  TAKE 1 TABLET BY MOUTH 2 TIMES DAILY     nitroGLYCERIN 0.4 MG SL tablet  Commonly known as:  NITROSTAT  Place 1 tablet (0.4 mg total) under the tongue every 5 (five) minutes as needed.     omega-3 acid ethyl esters 1 G capsule  Commonly known as:  LOVAZA  Take 1 g by mouth 2 (two) times daily.        Allergies:  Allergies  Allergen Reactions  . Statins Other (See Comments)    Myalgias with 3 different statins    Past Medical History  Diagnosis Date  . CAD (coronary artery disease)     a. s/p PCI to RCA 2002;  b. NSTEMI 10/11: cath with pLAD 30%, mLAD 30-40%, pCFX 20%, mCFX 60%, dCFX occluded (med Rx); pRCA 20%, dRCA 95% before stent tx with BMS, RCA stent ok, m-d PDA 90-95% (med Rx); pPL2 80% tx with DES; EF 55% c.  75% distal RCA ISR s/p DES. 70% mid LCx unchanged from prior studies. Patent prior RCA, PLA stents. Subtotal mid-dis PDA occlusion. EF  55-60%.   . Hyperlipidemia     intolerant to statins => fenofibrate started 1/14 (Trigs > 400)  . Hypertension   . TIA (transient ischemic attack) 2002  . Type 2 diabetes mellitus     dx 04/2012 => metformin started  . Hx of echocardiogram     a. Echo 04/04/12: Mild LVH, EF 55-60%, normal wall motion, mildly calcified aortic valve annulus, mild RVE.    Past Surgical History  Procedure Laterality Date  . Hernia repair  2007    [facility]  . Hernia repair   02/04/11    BIH repair   . Coronary angioplasty with stent placement  2011    Dist RCA 4.0 x 15 mm BMS, PL 2.5 x 12 mm ION DES  . Coronary angioplasty  03/2012    75% distal RCA ISR s/p DES. 70% mid LCx unchanged from prior studies. Patent prior RCA, PLA stents. Subtotal mid-dis PDA occlusion. Diffuse nonobstructive disease elsewhere. LVEF 55-65%    Family History  Problem Relation Age of Onset  . Heart disease Mother     Social History:  reports that he has never smoked. He has never used smokeless tobacco. He reports that he does not drink alcohol or use illicit drugs.    Review of Systems       Lipids: He has been treated with 3 types of statins but apparently because of muscle aches he could not tolerate any of them and is taking fenofibrate only.        Known history of CAD         The blood pressure has been high for several years and has been managed by his cardiologist, currently taking 3 drugs   Physical Examination:  BP 128/70  Pulse 64  Temp(Src) 98.6 F (37 C)  Resp 12  Ht 5\' 11"  (1.803 m)  Wt 260 lb 14.4 oz (118.343 kg)  BMI 36.4 kg/m2  SpO2 97%   no ankle edema     ASSESSMENT:  Diabetes type 2  He has started taking metformin and but has not been seen for diabetes education visit Has not started glucose monitoring and will be instructed on how to use a home glucose meter today Also he is having high reading today of 202 in the office after breakfast and explained to him that he should have protein at the morning meal and less carbohydrate  He will be scheduled to see the dietitian for meal planning and general diabetes education, he has been reluctant to do this  HYPERTENSION: he was told  to cut back on HCTZ to 12.5 mg to minimize effects on electrolyte imbalance, lipids and insulin sensitivity. had a low potassium on the last  visit. Consider using ACE inhibitor or ARB drug instead of amlodipine, deferred to cardiologist.   HYPERLIPIDEMIA: because of  CAD will start him on low-dose pravastatin, discussed benefits of statin drugs and importance and prevention of further events Will consider increasing the dose or even switching to Livalo if not tolerated   Counseling time over 50% of today's 25 minute visit  [name] 01/22/2013, 8:25 AM     No visits with results within 1 Week(s) from this visit. Latest known visit with results is:  Office Visit on 01/11/2013  Component Date Value Range Status  . Sodium 01/11/2013 135  135 - 145 mEq/L Final  . Potassium 01/11/2013 3.6  3.5 - 5.1 mEq/L Final  . Chloride 01/11/2013 101  96 - 112 mEq/L Final  . CO2 01/11/2013 28  19 - 32 mEq/L Final  . Glucose, Bld 01/11/2013 147* 70 - 99 mg/dL Final  . BUN 16/01/9603 21  6 - 23 mg/dL Final  . Creatinine, Ser 01/11/2013 1.2  0.4 - 1.5 mg/dL Final  . Calcium 54/12/8117 8.8  8.4 - 10.5 mg/dL Final  . GFR 14/78/2956 66.40  >60.00 mL/min Final    Addendum: Has high triglycerides, if not improved consider using fenofibrate

## 2013-01-22 NOTE — Patient Instructions (Addendum)
Please check blood sugars at least every other day about 2 hours after any meal and once or twice a week on waking up.   Please bring blood sugar monitor to each visit  Have protein like eggs or low fat cheese or meat with breakfast daily Avoid high fat and fried foods  Reduce hydrochlorothiazide to half tablet Continue metformin half tablet twice daily  Please see the dietitian for meal planning and general diabetes education   Start pravastatin 10 mg daily for cholesterol, may take this at bedtime

## 2013-01-23 LAB — FRUCTOSAMINE: Fructosamine: 271 umol/L (ref <285)

## 2013-02-01 ENCOUNTER — Other Ambulatory Visit: Payer: Self-pay | Admitting: Cardiology

## 2013-02-03 ENCOUNTER — Other Ambulatory Visit: Payer: Self-pay | Admitting: Cardiology

## 2013-03-24 ENCOUNTER — Encounter: Payer: Self-pay | Admitting: *Deleted

## 2013-03-24 ENCOUNTER — Other Ambulatory Visit: Payer: Medicare Other

## 2013-03-24 ENCOUNTER — Ambulatory Visit: Payer: Medicare Other | Admitting: Endocrinology

## 2013-03-24 DIAGNOSIS — Z0289 Encounter for other administrative examinations: Secondary | ICD-10-CM

## 2013-03-31 ENCOUNTER — Other Ambulatory Visit: Payer: Self-pay | Admitting: Cardiology

## 2013-05-13 IMAGING — CT CT PELVIS W/O CM
2 of 3 series · 17 of 46 positions shown, 19 images · IV contrast (30CC OMNI 300)
Comparison: None.

CLINICAL DATA: Left groin pain.

CT PELVIS WITHOUT CONTRAST
TECHNIQUE: Multidetector CT imaging of the pelvis was performed
following the standard protocol without intravenous contrast.

[Series 2: routine pelvis · axial · 0.81mm/px · z∈[-358,-54]mm · 14 of 67 slices shown, 16 images]
[im 5/67  soft-tissue]
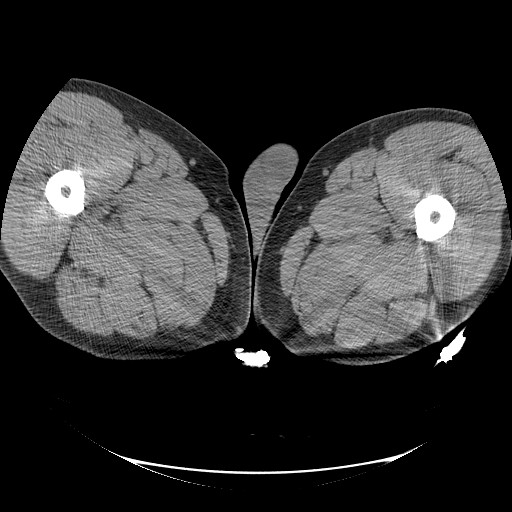
[im 5/67  bone]
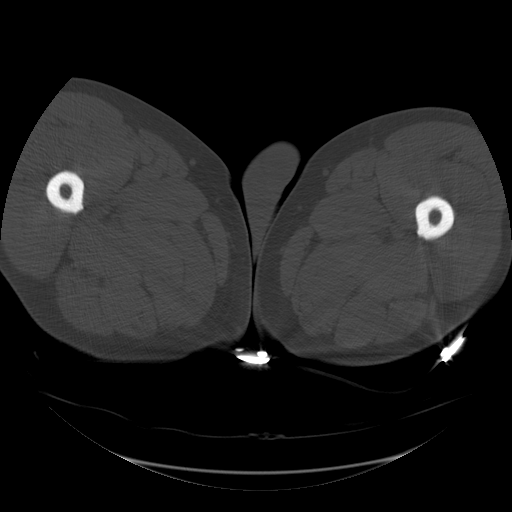
[im 9/67  soft-tissue]
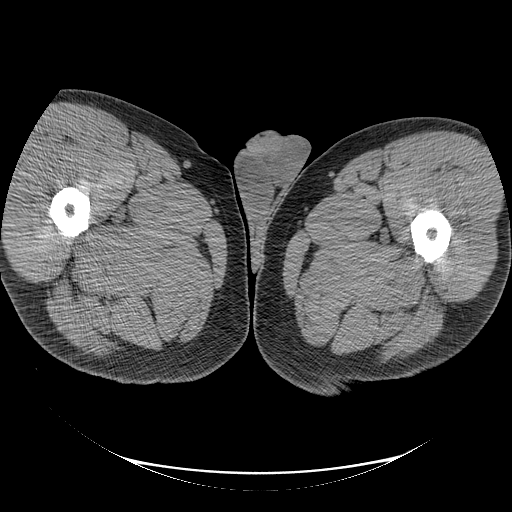
[im 13/67  soft-tissue]
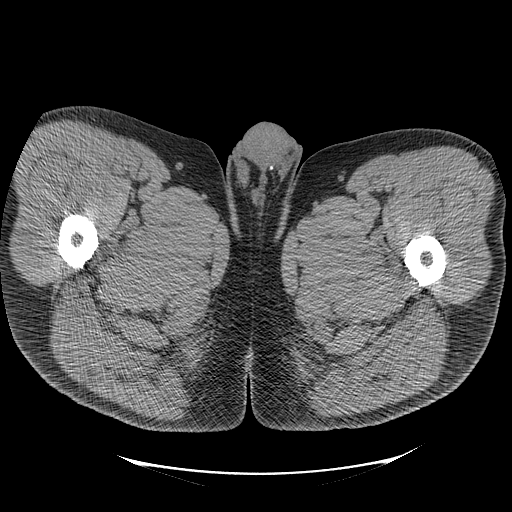
[im 18/67  soft-tissue]
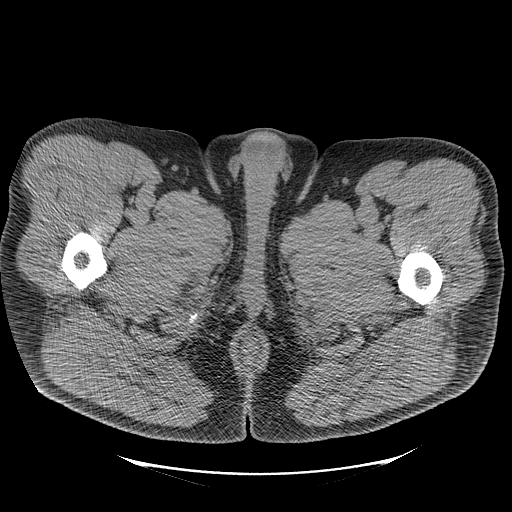
[im 22/67  soft-tissue]
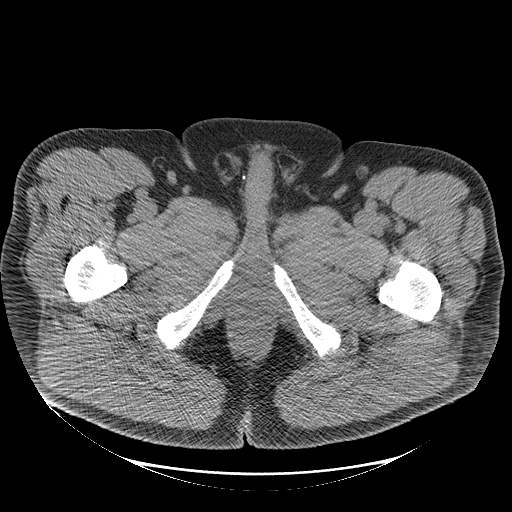
[im 26/67  soft-tissue]
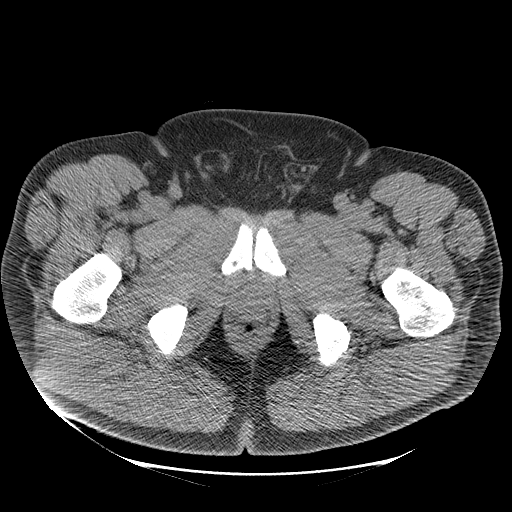
[im 30/67  soft-tissue]
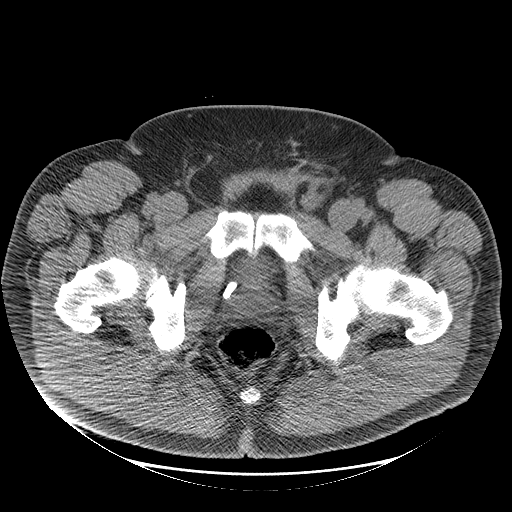
[im 37/67  soft-tissue]
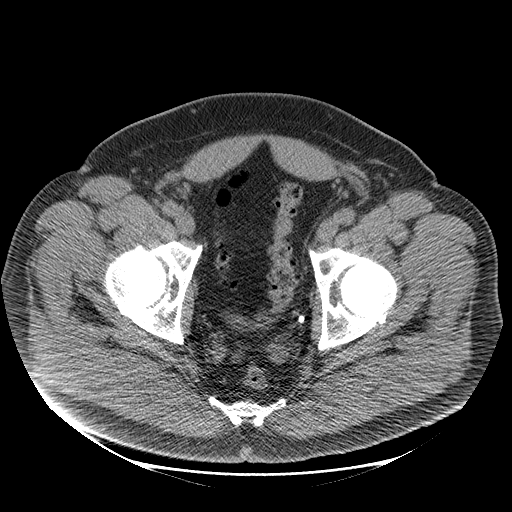
[im 41/67  soft-tissue]
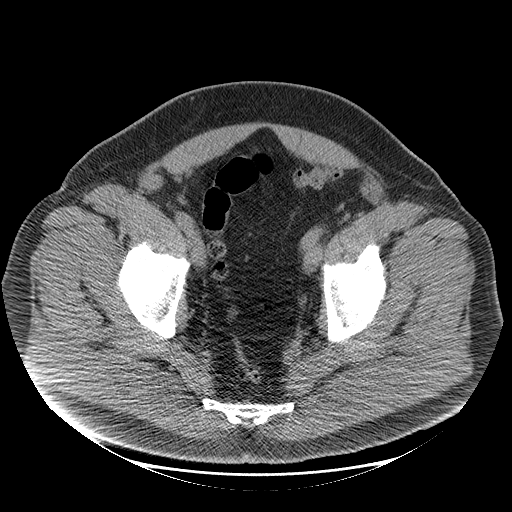
[im 41/67  bone]
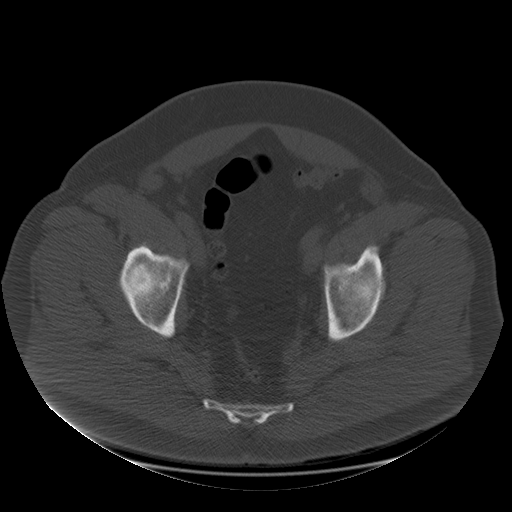
[im 45/67  soft-tissue]
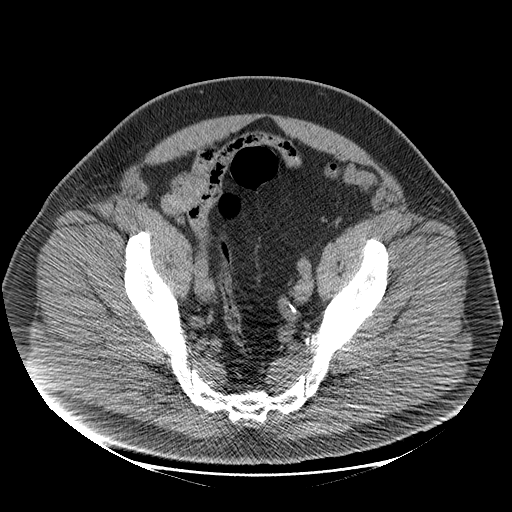
[im 49/67  soft-tissue]
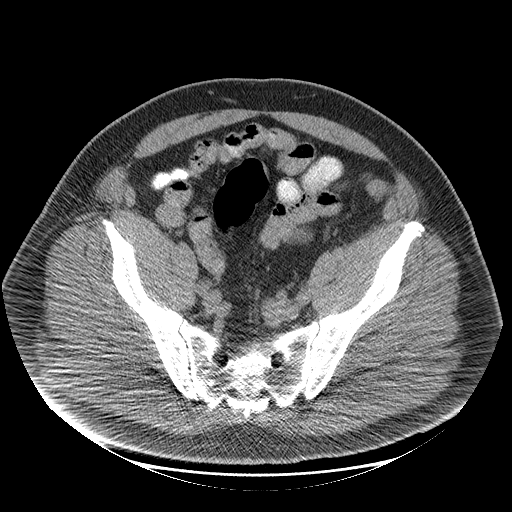
[im 54/67  soft-tissue]
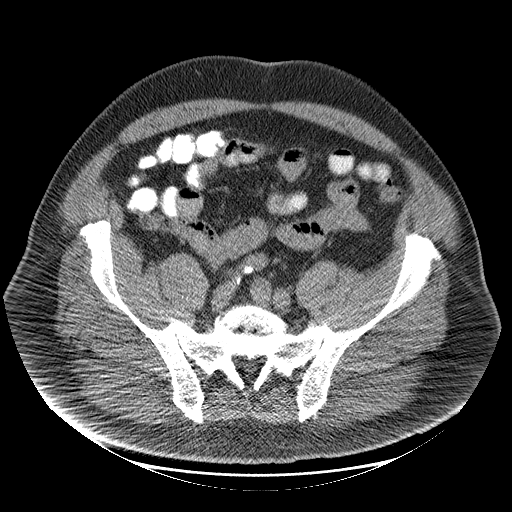
[im 58/67  soft-tissue]
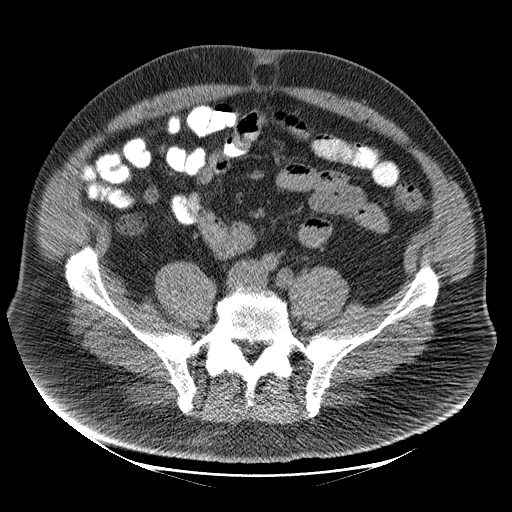
[im 62/67  soft-tissue]
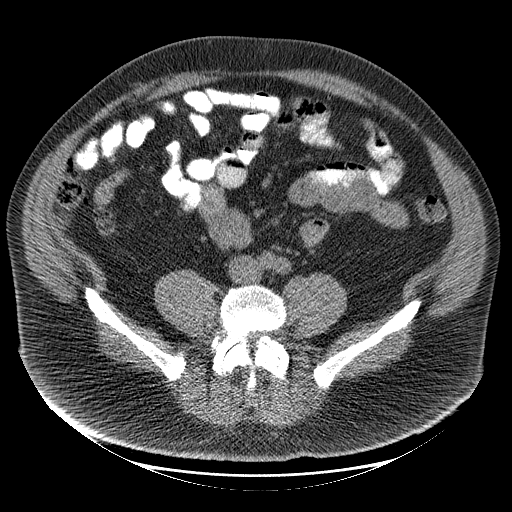

[Series 300: coronals · coronal · 0.81mm/px · 3 of 146 slices shown]
[im 49/146  soft-tissue]
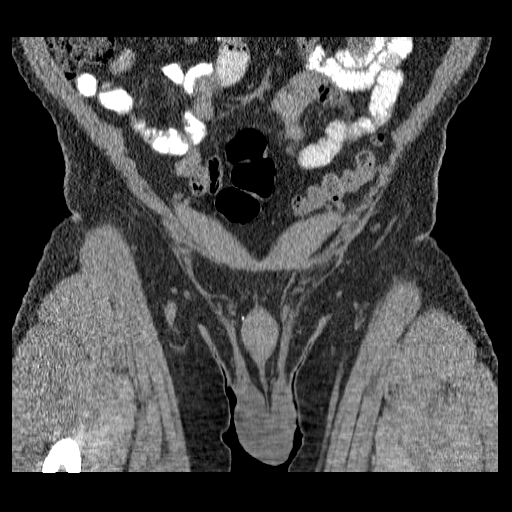
[im 65/146  soft-tissue]
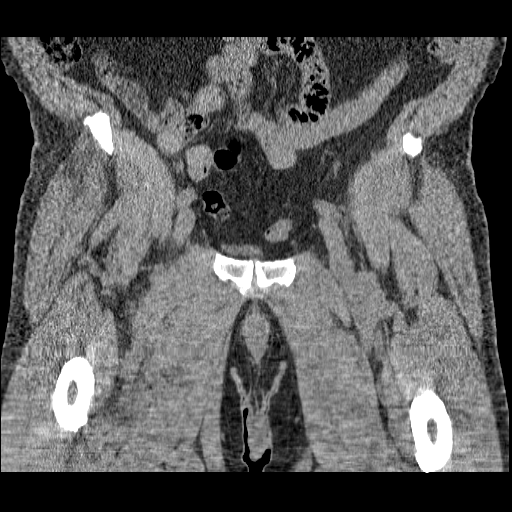
[im 81/146  soft-tissue]
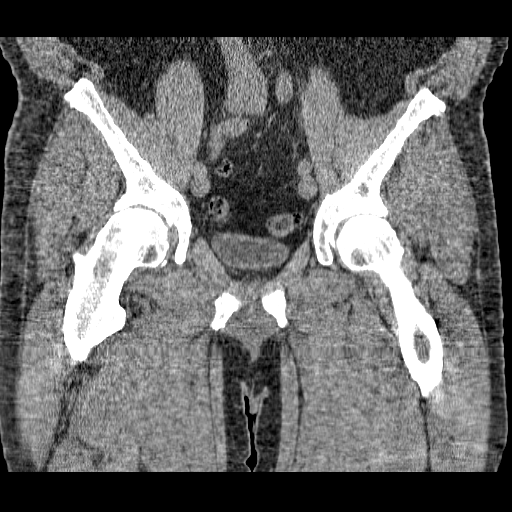

[17 of 46 positions shown; findings below may reference images not displayed]

FINDINGS: A small umbilical hernia contains adipose tissue.

Mild sigmoid diverticulosis noted without active diverticulitis.

No free pelvic fluid is observed.  Calcification below the right
sided urinary bladder is likely incidental in the urinary bladder
itself appears unremarkable.

No pathologic pelvic adenopathy is identified.

There is bridging spurring of both sacroiliac joints along with
facet arthropathy bilaterally at L4-5 and L5-S1.  There is left
foraminal stenosis at L5-S1 and prominent right foraminal stenosis
at L4-5.

Bilateral groin hernias appear to be direct inguinal hernias based
on the relationship to the inferior epigastric vessels.  The left-
sided hernia is presumed to be recurrent, and there is a band of
stranding within the herniated adipose tissue shown on images 34-37
of series 2.

No hip effusion or regional bursitis is observed.  No sciatic notch
impingement.
IMPRESSION: 1.  Bilateral direct appearing inguinal hernias.  The left hernia
has a thick band of stranding within the herniated adipose tissue,
potentially representing the mesh.
2.  Small umbilical hernia contains adipose tissue.
3.  Mild sigmoid diverticulosis.
4.  Bridging spurring of both sacroiliac joints.
5.  Lower lumbar facet arthropathy causing left foraminal stenosis
L5-S1 and considerable right foraminal stenosis at L4-5.

## 2013-09-02 ENCOUNTER — Other Ambulatory Visit: Payer: Self-pay | Admitting: Cardiology

## 2013-09-21 ENCOUNTER — Other Ambulatory Visit: Payer: Self-pay | Admitting: Cardiology

## 2013-12-22 ENCOUNTER — Other Ambulatory Visit: Payer: Self-pay | Admitting: Cardiology

## 2013-12-22 ENCOUNTER — Other Ambulatory Visit: Payer: Self-pay | Admitting: Endocrinology

## 2013-12-29 ENCOUNTER — Ambulatory Visit: Payer: Medicare Other | Admitting: Endocrinology

## 2014-01-05 ENCOUNTER — Encounter: Payer: Self-pay | Admitting: Internal Medicine

## 2014-01-24 ENCOUNTER — Encounter: Payer: Self-pay | Admitting: Cardiology

## 2014-01-24 ENCOUNTER — Ambulatory Visit (INDEPENDENT_AMBULATORY_CARE_PROVIDER_SITE_OTHER): Payer: Managed Care, Other (non HMO) | Admitting: Cardiology

## 2014-01-24 ENCOUNTER — Ambulatory Visit: Payer: Medicare Other | Admitting: Endocrinology

## 2014-01-24 VITALS — BP 132/60 | HR 77 | Ht 73.0 in | Wt 260.3 lb

## 2014-01-24 DIAGNOSIS — I2510 Atherosclerotic heart disease of native coronary artery without angina pectoris: Secondary | ICD-10-CM

## 2014-01-24 NOTE — Progress Notes (Signed)
HPI:FU CAD; s/p PCI to the RCA in 2002, s/p NSTEMI 10/11 treated with a BMS to the distal RCA and DES to the PL2. Abdominal U/S 12/13: No AAA. Admitted 12/13 with UA. Echo 04/04/12: Mild LVH, EF 55-60%, normal wall motion, mildly calcified aortic valve annulus, mild RVE. LHC 04/06/12: mLAD 50%, proximal D2 60%, mCFX 70% (unchanged from previous study), mRCA 30-40%, dRCA stent patent followed by an area of moderately severe ISR at 75%, PLA stent patent, EF 55-65%. PCI: Promus Premier DES to dRCA ISR. DAPT recommended x 1 year. Patient has been intolerant to statins. Since last seen, the patient denies any dyspnea on exertion, orthopnea, PND, pedal edema, palpitations, syncope or chest pain.     Current Outpatient Prescriptions  Medication Sig Dispense Refill  . albuterol (PROVENTIL HFA;VENTOLIN HFA) 108 (90 BASE) MCG/ACT inhaler Inhale 1-2 puffs into the lungs every 6 (six) hours as needed for wheezing or shortness of breath.      Marland Kitchen amLODipine (NORVASC) 5 MG tablet TAKE 1 TABLET BY MOUTH DAILY  90 tablet  0  . aspirin 81 MG chewable tablet Chew 1 tablet (81 mg total) by mouth daily.      . hydrochlorothiazide (HYDRODIURIL) 25 MG tablet TAKE 1 TABLET BY MOUTH DAILY  30 tablet  3  . ipratropium (ATROVENT) 0.06 % nasal spray Place 2 sprays into both nostrils as needed for rhinitis.      . metFORMIN (GLUCOPHAGE) 1000 MG tablet Take 1/2 tablet with breakfast and 1/2 tablet with evening meal  30 tablet  5  . metoprolol tartrate (LOPRESSOR) 25 MG tablet TAKE 1 TABLET BY MOUTH TWICE DAILY  60 tablet  10  . nitroGLYCERIN (NITROSTAT) 0.4 MG SL tablet Place 1 tablet (0.4 mg total) under the tongue every 5 (five) minutes as needed.  25 tablet  12   No current facility-administered medications for this visit.     Past Medical History  Diagnosis Date  . CAD (coronary artery disease)     a. s/p PCI to RCA 2002;  b. NSTEMI 10/11: cath with pLAD 30%, mLAD 30-40%, pCFX 20%, mCFX 60%, dCFX occluded (med  Rx); pRCA 20%, dRCA 95% before stent tx with BMS, RCA stent ok, m-d PDA 90-95% (med Rx); pPL2 80% tx with DES; EF 55% c.  75% distal RCA ISR s/p DES. 70% mid LCx unchanged from prior studies. Patent prior RCA, PLA stents. Subtotal mid-dis PDA occlusion. EF  55-60%.   . Hyperlipidemia     intolerant to statins => fenofibrate started 1/14 (Trigs > 400)  . Hypertension   . TIA (transient ischemic attack) 2002  . Type 2 diabetes mellitus     dx 04/2012 => metformin started  . Hx of echocardiogram     a. Echo 04/04/12: Mild LVH, EF 55-60%, normal wall motion, mildly calcified aortic valve annulus, mild RVE.    Past Surgical History  Procedure Laterality Date  . Hernia repair  2007    LIH  . Hernia repair  02/04/11    BIH repair   . Coronary angioplasty with stent placement  2011    Dist RCA 4.0 x 15 mm BMS, PL 2.5 x 12 mm ION DES  . Coronary angioplasty  03/2012    75% distal RCA ISR s/p DES. 70% mid LCx unchanged from prior studies. Patent prior RCA, PLA stents. Subtotal mid-dis PDA occlusion. Diffuse nonobstructive disease elsewhere. LVEF 55-65%    History   Social History  . Marital Status:  Married    Spouse Name: N/A    Number of Children: N/A  . Years of Education: N/A   Occupational History  . Works on [employer]    Social History Main Topics  . Smoking status: Never Smoker   . Smokeless tobacco: Never Used  . Alcohol Use: No  . Drug Use: No  . Sexual Activity:    Other Topics Concern  . Not on file   Social History Narrative   Lives with wife, is active around the house and yard. Still works. Admits he does not eat healthy food.    ROS: no fevers or chills, productive cough, hemoptysis, dysphasia, odynophagia, melena, hematochezia, dysuria, hematuria, rash, seizure activity, orthopnea, PND, pedal edema, claudication. Remaining systems are negative.  Physical Exam: Well-developed well-nourished in no acute distress.  Skin is warm and dry.  HEENT is normal.    Neck is supple.  Chest is clear to auscultation with normal expansion.  Cardiovascular exam is regular rate and rhythm.  Abdominal exam nontender or distended. No masses palpated. Extremities show no edema. neuro grossly intact  ECG Sinus rhythm at a rate of 77. Cannot rule out prior inferior infarct.

## 2014-01-24 NOTE — Assessment & Plan Note (Signed)
Blood pressure controlled. Continue present medications. Potassium and renal function monitored by primary care. 

## 2014-01-24 NOTE — Assessment & Plan Note (Signed)
Continue aspirin. Intolerant to statins. 

## 2014-01-24 NOTE — Patient Instructions (Signed)
Your physician wants you to follow-up in: ONE YEAR WITH DR CRENSHAW You will receive a reminder letter in the mail two months in advance. If you don't receive a letter, please call our office to schedule the follow-up appointment.  

## 2014-01-24 NOTE — Assessment & Plan Note (Signed)
Continue diet. Intolerant to statins. 

## 2014-02-23 ENCOUNTER — Ambulatory Visit (AMBULATORY_SURGERY_CENTER): Payer: Self-pay

## 2014-02-23 VITALS — Ht 72.0 in | Wt 265.2 lb

## 2014-02-23 DIAGNOSIS — Z1211 Encounter for screening for malignant neoplasm of colon: Secondary | ICD-10-CM

## 2014-02-23 NOTE — Progress Notes (Signed)
Per pt, no allergies to soy or egg products.Pt not taking any weight loss meds or using  O2 at home. 

## 2014-02-25 ENCOUNTER — Encounter: Payer: Self-pay | Admitting: Internal Medicine

## 2014-03-09 ENCOUNTER — Ambulatory Visit (AMBULATORY_SURGERY_CENTER): Payer: Managed Care, Other (non HMO) | Admitting: Internal Medicine

## 2014-03-09 ENCOUNTER — Encounter: Payer: Self-pay | Admitting: Internal Medicine

## 2014-03-09 VITALS — BP 137/77 | HR 65 | Temp 98.4°F | Resp 22 | Ht 72.0 in | Wt 265.0 lb

## 2014-03-09 DIAGNOSIS — D120 Benign neoplasm of cecum: Secondary | ICD-10-CM

## 2014-03-09 DIAGNOSIS — D122 Benign neoplasm of ascending colon: Secondary | ICD-10-CM

## 2014-03-09 DIAGNOSIS — Z1211 Encounter for screening for malignant neoplasm of colon: Secondary | ICD-10-CM

## 2014-03-09 DIAGNOSIS — D123 Benign neoplasm of transverse colon: Secondary | ICD-10-CM

## 2014-03-09 LAB — GLUCOSE, CAPILLARY
GLUCOSE-CAPILLARY: 136 mg/dL — AB (ref 70–99)
Glucose-Capillary: 129 mg/dL — ABNORMAL HIGH (ref 70–99)

## 2014-03-09 MED ORDER — SODIUM CHLORIDE 0.9 % IV SOLN: 500.0000 mL | INTRAVENOUS | Status: DC

## 2014-03-09 NOTE — Patient Instructions (Addendum)
I found and removed 4 polyps. You also have a condition called diverticulosis - common and not usually a problem. Please read the handout provided.  Prostate exam was normal.  I will let you know pathology results and when to have another routine colonoscopy by mail.  I appreciate the opportunity to care for you. Gatha Mayer, MD, FACG  YOU HAD AN ENDOSCOPIC PROCEDURE TODAY AT [facility]: Refer to the procedure report that was given to you for any specific questions about what was found during the examination.  If the procedure report does not answer your questions, please call your gastroenterologist to clarify.  If you requested that your care partner not be given the details of your procedure findings, then the procedure report has been included in a sealed envelope for you to review at your convenience later.  YOU SHOULD EXPECT: Some feelings of bloating in the abdomen. Passage of more gas than usual.  Walking can help get rid of the air that was put into your GI tract during the procedure and reduce the bloating. If you had a lower endoscopy (such as a colonoscopy or flexible sigmoidoscopy) you may notice spotting of blood in your stool or on the toilet paper. If you underwent a bowel prep for your procedure, then you may not have a normal bowel movement for a few days.  DIET: Your first meal following the procedure should be a light meal and then it is ok to progress to your normal diet.  A half-sandwich or bowl of soup is an example of a good first meal.  Heavy or fried foods are harder to digest and may make you feel nauseous or bloated.  Likewise meals heavy in dairy and vegetables can cause extra gas to form and this can also increase the bloating.  Drink plenty of fluids but you should avoid alcoholic beverages for 24 hours.  ACTIVITY: Your care partner should take you home directly after the procedure.  You should plan to take it easy, moving slowly for the  rest of the day.  You can resume normal activity the day after the procedure however you should NOT DRIVE or use heavy machinery for 24 hours (because of the sedation medicines used during the test).    SYMPTOMS TO REPORT IMMEDIATELY: A gastroenterologist can be reached at any hour.  During normal business hours, 8:30 AM to 5:00 PM Monday through Friday, call [phone].  After hours and on weekends, please call the GI answering service at [phone] who will take a message and have the physician on call contact you.   Following lower endoscopy (colonoscopy or flexible sigmoidoscopy):  Excessive amounts of blood in the stool  Significant tenderness or worsening of abdominal pains  Swelling of the abdomen that is new, acute  Fever of 100F or higher   FOLLOW UP: If any biopsies were taken you will be contacted by phone or by letter within the next 1-3 weeks.  Call your gastroenterologist if you have not heard about the biopsies in 3 weeks.  Our staff will call the home number listed on your records the next business day following your procedure to check on you and address any questions or concerns that you may have at that time regarding the information given to you following your procedure. This is a courtesy call and so if there is no answer at the home number and we have not heard from you through the emergency physician on call,  we will assume that you have returned to your regular daily activities without incident.  SIGNATURES/CONFIDENTIALITY: You and/or your care partner have signed paperwork which will be entered into your electronic medical record.  These signatures attest to the fact that that the information above on your After Visit Summary has been reviewed and is understood.  Full responsibility of the confidentiality of this discharge information lies with you and/or your care-partner.  Polyp, diverticulosis information given.

## 2014-03-09 NOTE — Op Note (Signed)
[facility]  [address] [place], [id number]   COLONOSCOPY PROCEDURE REPORT  PATIENT: [name], [name]  MR#: [id number] BIRTHDATE: Mar 18, 1946 , 68  yrs. old GENDER: male ENDOSCOPIST: Gatha Mayer, MD, [facility] PROCEDURE DATE:  03/09/2014 PROCEDURE:   Colonoscopy with snare polypectomy First Screening Colonoscopy - Avg.  risk and is 50 yrs.  old or older Yes.  Prior Negative Screening - Now for repeat screening. N/A  History of Adenoma - Now for follow-up colonoscopy & has been > or = to 3 yrs.  N/A  Polyps Removed Today? Yes. ASA CLASS:   Class II INDICATIONS:average risk for colorectal cancer. MEDICATIONS: Propofol 220 mg IV and Monitored anesthesia care  DESCRIPTION OF PROCEDURE:   After the risks benefits and alternatives of the procedure were thoroughly explained, informed consent was obtained.  The digital rectal exam revealed no abnormalities of the rectum, revealed no prostatic nodules, and revealed the prostate was not enlarged.   The LB [id number] endoscope was introduced through the anus and advanced to the cecum, which was identified by both the appendix and ileocecal valve. No adverse events experienced.   The quality of the prep was excellent, using MiraLax  The instrument was then slowly withdrawn as the colon was fully examined.  COLON FINDINGS: Four sessile polyps ranging from 4 to 23mm in size were found in the transverse colon, ascending colon, and at the cecum.   There was diverticulosis noted in the sigmoid colon.   The examination was otherwise normal.  Retroflexed views revealed no abnormalities. The time to cecum=3 minutes 37 seconds.  Withdrawal time=13 minutes 18 seconds.  The scope was withdrawn and the procedure completed. COMPLICATIONS: There were no immediate complications. ENDOSCOPIC IMPRESSION: 1.   Four sessile polyps ranging from 4 to 42mm in size were found in the transverse colon, ascending colon, and at the cecum 2.    Diverticulosis was noted in the sigmoid colon 3.   The examination was otherwise normal - excellent prep  RECOMMENDATIONS: Timing of repeat colonoscopy will be determined by pathology findings. eSigned:  Gatha Mayer, MD, [facility] 03/09/2014 12:05 PM cc: Cecille Amsterdam. MD and The Patient

## 2014-03-09 NOTE — Progress Notes (Signed)
Report to PACU, RN, vss, BBS= Clear.  

## 2014-03-09 NOTE — Progress Notes (Signed)
Called to room to assist during endoscopic procedure.  Patient ID and intended procedure confirmed with present staff. Received instructions for my participation in the procedure from the performing physician.  

## 2014-03-10 ENCOUNTER — Telehealth: Payer: Self-pay | Admitting: *Deleted

## 2014-03-10 NOTE — Telephone Encounter (Signed)
  Follow up Call-  Call back number 03/09/2014  Post procedure Call Back phone  # [phone]  Permission to leave phone message Yes     Patient questions:  Message left to call us if necessary.

## 2014-03-14 ENCOUNTER — Encounter: Payer: Self-pay | Admitting: Internal Medicine

## 2014-03-14 DIAGNOSIS — Z86010 Personal history of colonic polyps: Secondary | ICD-10-CM

## 2014-03-14 DIAGNOSIS — Z860101 Personal history of adenomatous and serrated colon polyps: Secondary | ICD-10-CM

## 2014-03-14 HISTORY — DX: Personal history of colonic polyps: Z86.010

## 2014-03-14 HISTORY — DX: Personal history of adenomatous and serrated colon polyps: Z86.0101

## 2014-03-14 NOTE — Progress Notes (Signed)
Quick Note:  SSP's and adenomas max 7 mm and total 4 - repeat colon 2018-19 ______

## 2014-03-17 ENCOUNTER — Encounter (HOSPITAL_COMMUNITY): Payer: Self-pay | Admitting: Cardiovascular Disease

## 2014-04-18 ENCOUNTER — Telehealth: Payer: Self-pay | Admitting: Cardiology

## 2014-04-18 NOTE — Telephone Encounter (Signed)
Walk  in pt form " [facility]" Dental Clearance paper dropped off Sent interoffice to [facility] office this is for Dr.Crenshaw [phone]/km

## 2014-04-21 ENCOUNTER — Telehealth: Payer: Self-pay | Admitting: *Deleted

## 2014-04-21 NOTE — Telephone Encounter (Signed)
Clearance for 12 teeth extractions faxed to the number provided. Patient does not need SBE.

## 2014-08-08 IMAGING — CR DG CHEST 2V
2 series · 2 of 2 positions shown · non-contrast
Comparison: 01/30/2010

CLINICAL DATA: Chest pain, shortness of breath.

CHEST - 2 VIEW

[w chest pa]
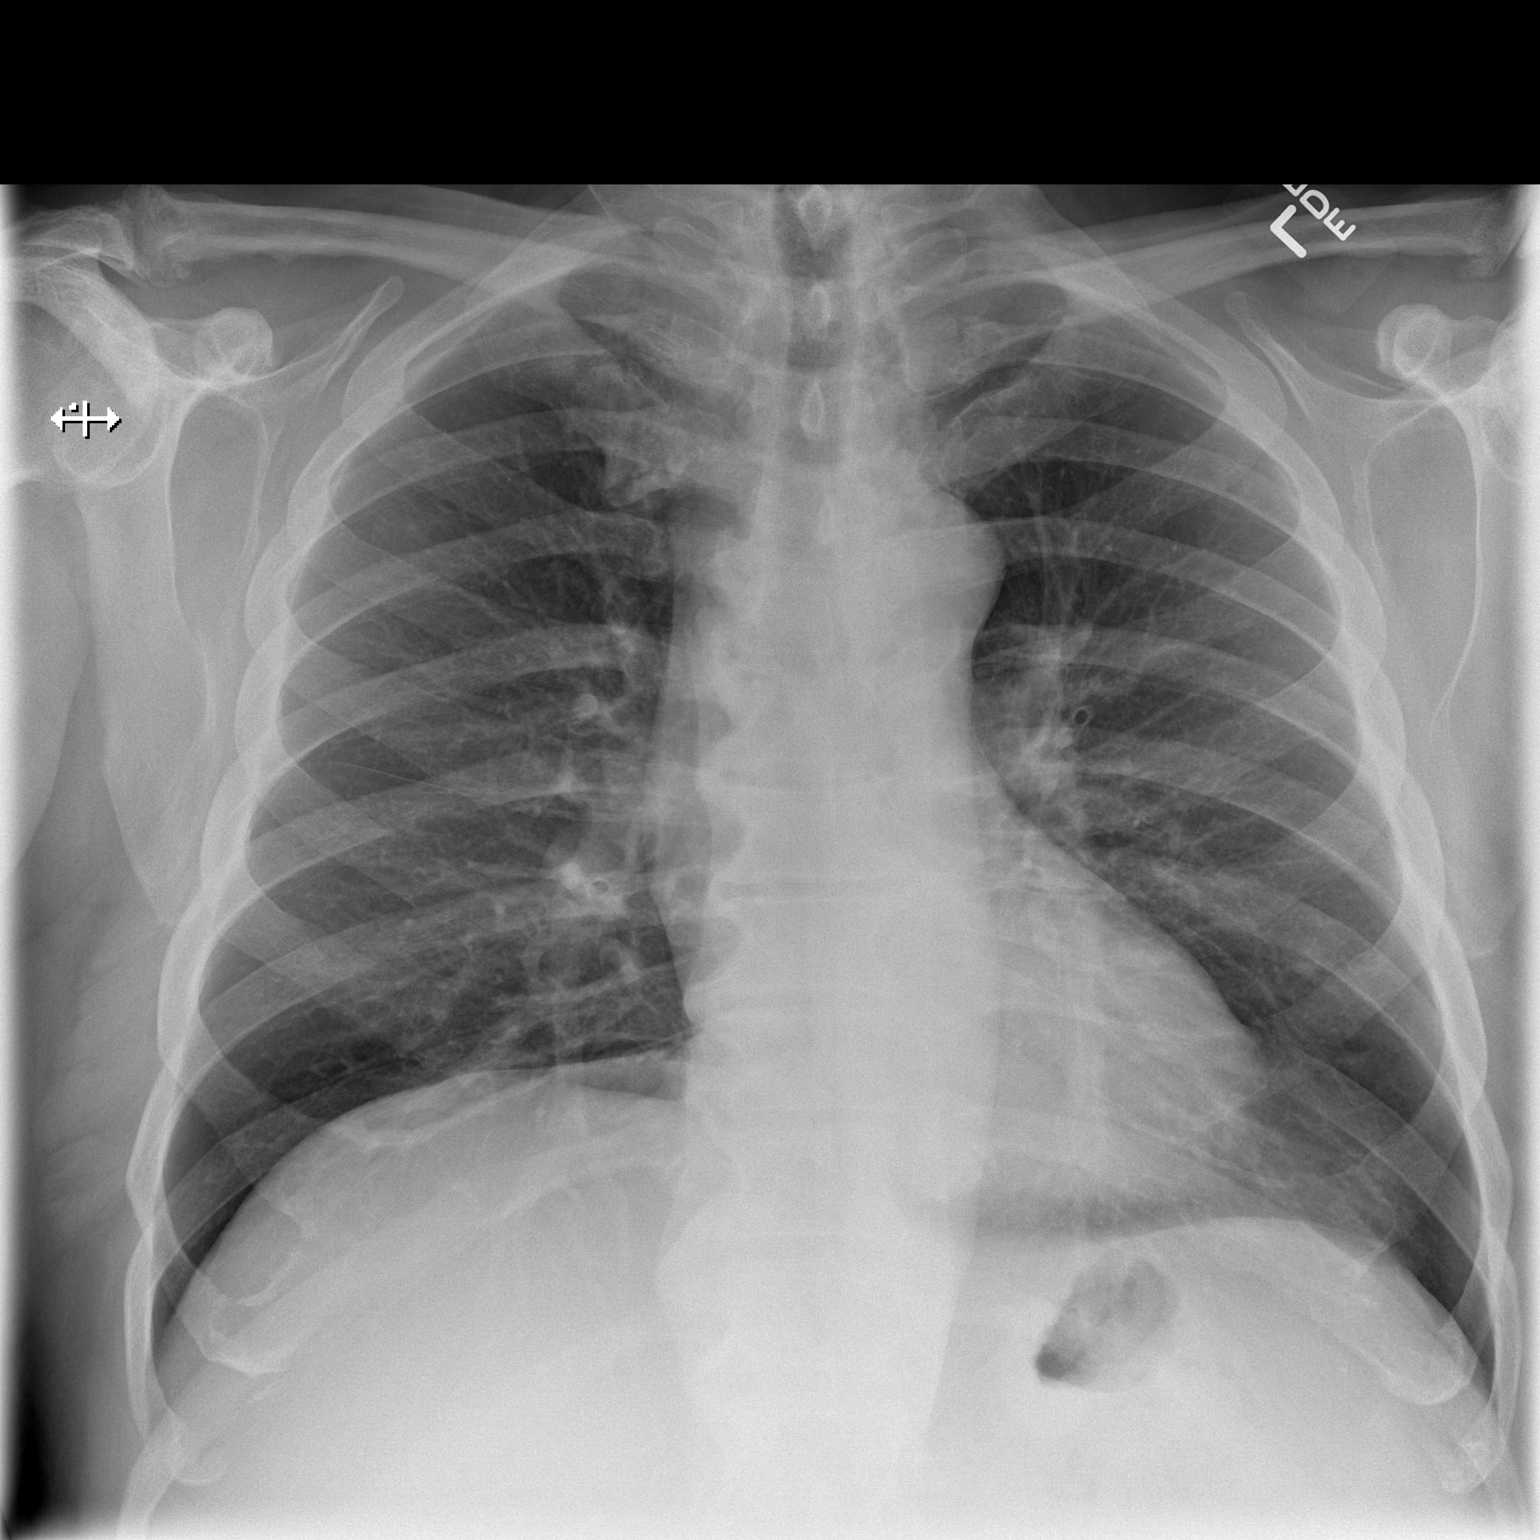

[w chest lat]
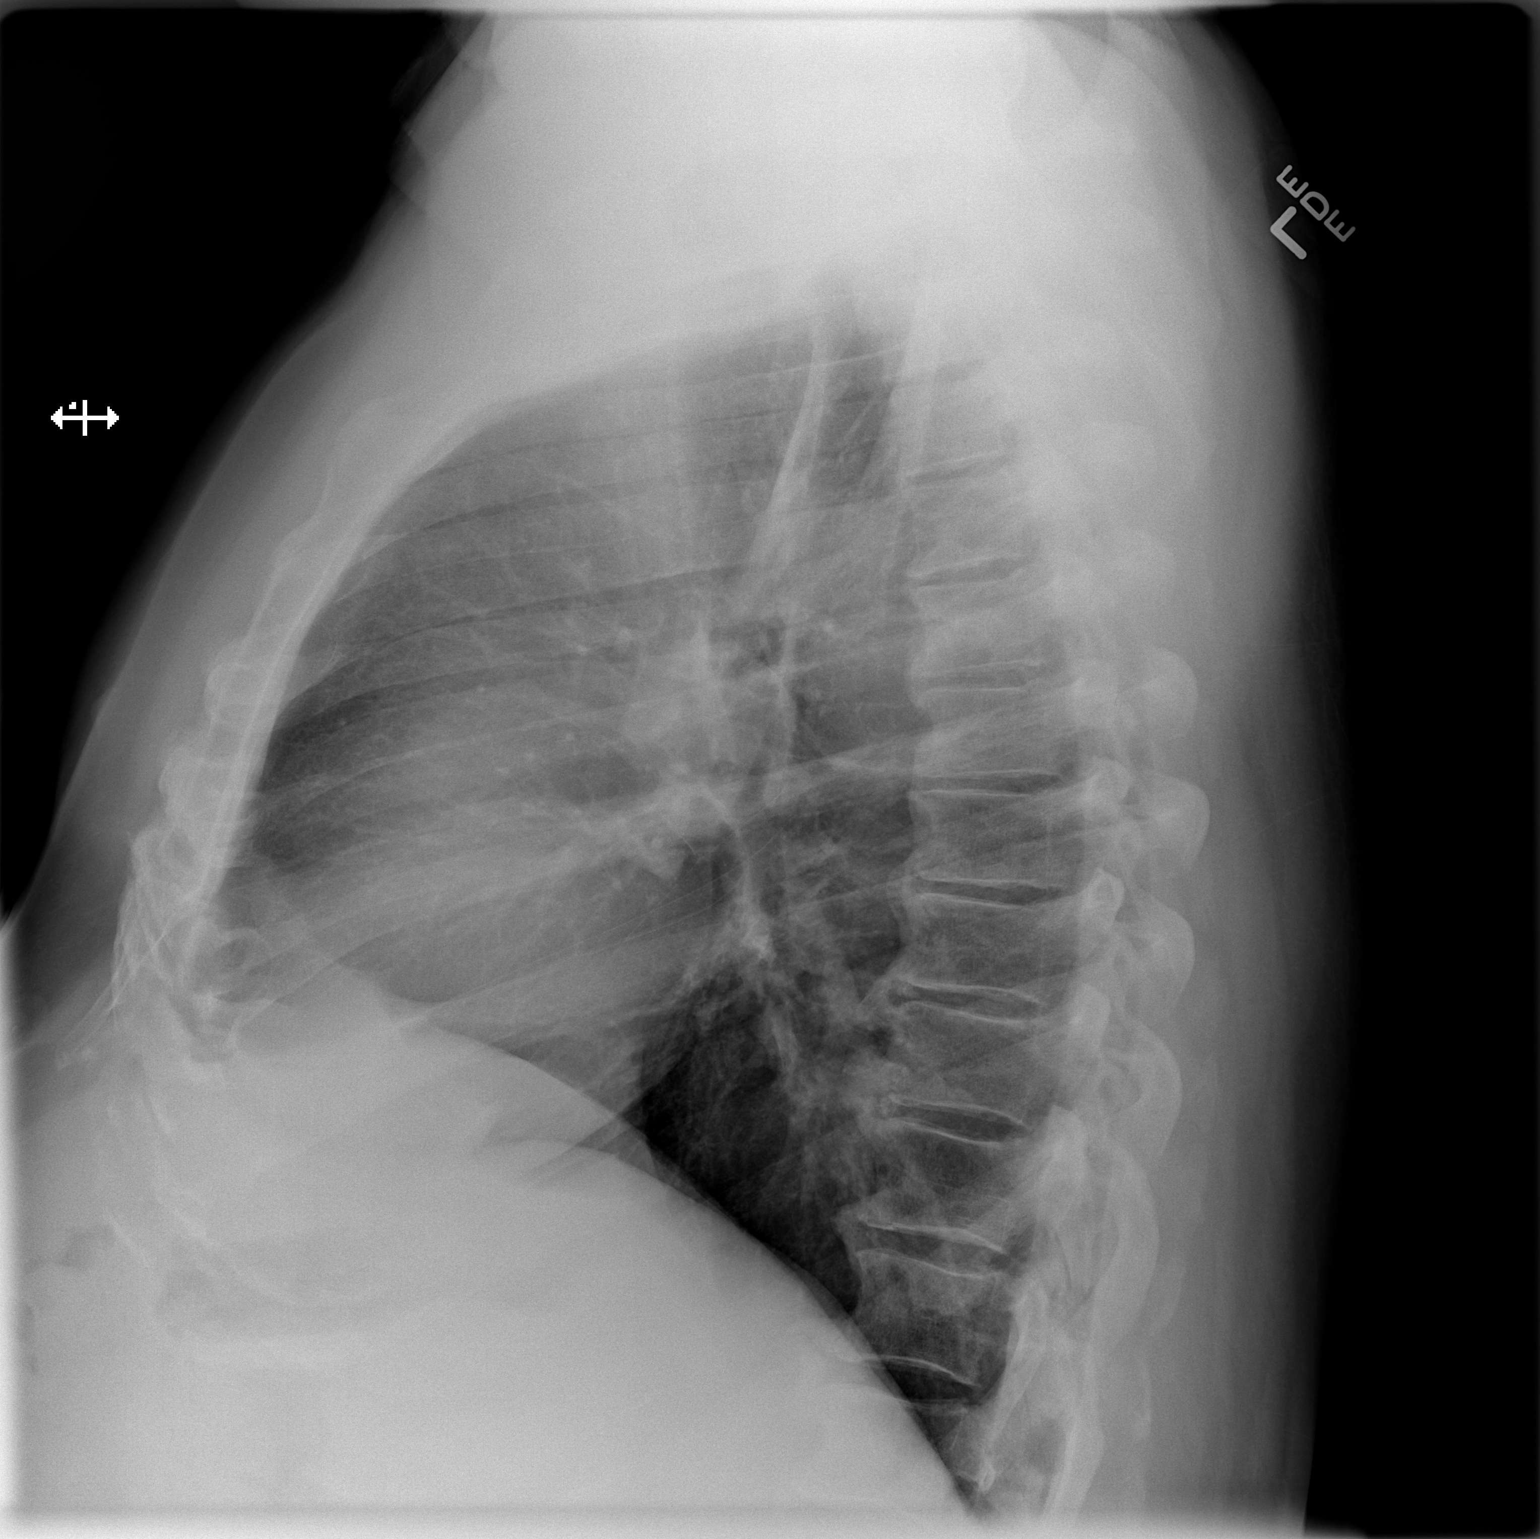

[2 of 2 positions shown; findings below may reference images not displayed]

FINDINGS: Prominent spurring about both acromioclavicular joints.
Flowing osteophytes at multiple contiguous levels in the mid
thoracic spine. Lungs clear.  Heart size and pulmonary vascularity
normal.  No effusion.
IMPRESSION: No acute disease

## 2015-05-17 ENCOUNTER — Encounter: Payer: Self-pay | Admitting: Cardiology

## 2015-07-20 ENCOUNTER — Encounter: Payer: Self-pay | Admitting: Cardiovascular Disease

## 2017-04-23 ENCOUNTER — Encounter: Payer: Self-pay | Admitting: Internal Medicine

## 2017-04-24 ENCOUNTER — Inpatient Hospital Stay (HOSPITAL_COMMUNITY)
Admission: AD | Admit: 2017-04-24 | Discharge: 2017-04-26 | DRG: 247 | Disposition: A | Discharge status: Home / Self Care | Payer: Medicare HMO | Attending: Cardiovascular Disease | Admitting: Cardiovascular Disease

## 2017-04-24 ENCOUNTER — Encounter (HOSPITAL_COMMUNITY)
Admission: AD | Disposition: A | Discharge status: Home / Self Care | Payer: Self-pay | Attending: Cardiovascular Disease

## 2017-04-24 DIAGNOSIS — Z888 Allergy status to other drugs, medicaments and biological substances status: Secondary | ICD-10-CM | POA: Diagnosis not present

## 2017-04-24 DIAGNOSIS — Z8249 Family history of ischemic heart disease and other diseases of the circulatory system: Secondary | ICD-10-CM

## 2017-04-24 DIAGNOSIS — I361 Nonrheumatic tricuspid (valve) insufficiency: Secondary | ICD-10-CM | POA: Diagnosis not present

## 2017-04-24 DIAGNOSIS — R079 Chest pain, unspecified: Secondary | ICD-10-CM | POA: Diagnosis present

## 2017-04-24 DIAGNOSIS — I10 Essential (primary) hypertension: Secondary | ICD-10-CM | POA: Diagnosis present

## 2017-04-24 DIAGNOSIS — E876 Hypokalemia: Secondary | ICD-10-CM | POA: Diagnosis present

## 2017-04-24 DIAGNOSIS — Z955 Presence of coronary angioplasty implant and graft: Secondary | ICD-10-CM

## 2017-04-24 DIAGNOSIS — I2510 Atherosclerotic heart disease of native coronary artery without angina pectoris: Secondary | ICD-10-CM | POA: Diagnosis present

## 2017-04-24 DIAGNOSIS — E785 Hyperlipidemia, unspecified: Secondary | ICD-10-CM | POA: Diagnosis present

## 2017-04-24 DIAGNOSIS — Z7982 Long term (current) use of aspirin: Secondary | ICD-10-CM

## 2017-04-24 DIAGNOSIS — E119 Type 2 diabetes mellitus without complications: Secondary | ICD-10-CM | POA: Diagnosis present

## 2017-04-24 DIAGNOSIS — I2102 ST elevation (STEMI) myocardial infarction involving left anterior descending coronary artery: Secondary | ICD-10-CM | POA: Diagnosis present

## 2017-04-24 DIAGNOSIS — Z7984 Long term (current) use of oral hypoglycemic drugs: Secondary | ICD-10-CM

## 2017-04-24 DIAGNOSIS — I252 Old myocardial infarction: Secondary | ICD-10-CM | POA: Diagnosis not present

## 2017-04-24 DIAGNOSIS — I213 ST elevation (STEMI) myocardial infarction of unspecified site: Secondary | ICD-10-CM

## 2017-04-24 HISTORY — DX: ST elevation (STEMI) myocardial infarction of unspecified site: I21.3

## 2017-04-24 HISTORY — PX: LEFT HEART CATH AND CORONARY ANGIOGRAPHY: CATH118249

## 2017-04-24 HISTORY — PX: CORONARY/GRAFT ACUTE MI REVASCULARIZATION: CATH118305

## 2017-04-24 LAB — COMPREHENSIVE METABOLIC PANEL
ALK PHOS: 45 U/L (ref 38–126)
ALT: 15 U/L — ABNORMAL LOW (ref 17–63)
ANION GAP: 13 (ref 5–15)
AST: 21 U/L (ref 15–41)
Albumin: 3.6 g/dL (ref 3.5–5.0)
BILIRUBIN TOTAL: 0.7 mg/dL (ref 0.3–1.2)
BUN: 25 mg/dL — ABNORMAL HIGH (ref 6–20)
CALCIUM: 9.2 mg/dL (ref 8.9–10.3)
CO2: 22 mmol/L (ref 22–32)
Chloride: 103 mmol/L (ref 101–111)
Creatinine, Ser: 1.14 mg/dL (ref 0.61–1.24)
GFR calc non Af Amer: >60 mL/min (ref >60)
Glucose, Bld: 107 mg/dL — ABNORMAL HIGH (ref 65–99)
Potassium: 3.3 mmol/L — ABNORMAL LOW (ref 3.5–5.1)
Sodium: 138 mmol/L (ref 135–145)
TOTAL PROTEIN: 6 g/dL — AB (ref 6.5–8.1)

## 2017-04-24 LAB — GLUCOSE, CAPILLARY: GLUCOSE-CAPILLARY: 101 mg/dL — AB (ref 65–99)

## 2017-04-24 LAB — POCT I-STAT, CHEM 8
BUN: 26 mg/dL — ABNORMAL HIGH (ref 6–20)
CALCIUM ION: 1.26 mmol/L (ref 1.15–1.40)
CREATININE: 1.1 mg/dL (ref 0.61–1.24)
Chloride: 102 mmol/L (ref 101–111)
GLUCOSE: 112 mg/dL — AB (ref 65–99)
HCT: 40 % (ref 39.0–52.0)
HEMOGLOBIN: 13.6 g/dL (ref 13.0–17.0)
POTASSIUM: 3.3 mmol/L — AB (ref 3.5–5.1)
Sodium: 140 mmol/L (ref 135–145)
TCO2: 25 mmol/L (ref 22–32)

## 2017-04-24 LAB — CBC
HEMATOCRIT: 40 % (ref 39.0–52.0)
HEMOGLOBIN: 13.3 g/dL (ref 13.0–17.0)
MCH: 29.8 pg (ref 26.0–34.0)
MCHC: 33.3 g/dL (ref 30.0–36.0)
MCV: 89.7 fL (ref 78.0–100.0)
Platelets: 172 10*3/uL (ref 150–400)
RBC: 4.46 MIL/uL (ref 4.22–5.81)
RDW: 13.3 % (ref 11.5–15.5)
WBC: 5.2 10*3/uL (ref 4.0–10.5)

## 2017-04-24 LAB — POCT ACTIVATED CLOTTING TIME: Activated Clotting Time: 654 seconds

## 2017-04-24 LAB — TROPONIN I
TROPONIN I: 0.15 ng/mL — AB (ref <0.03)
TROPONIN I: 3.89 ng/mL — AB (ref <0.03)

## 2017-04-24 LAB — LIPID PANEL
CHOLESTEROL: 175 mg/dL (ref 0–200)
HDL: 28 mg/dL — AB (ref >40)
LDL Cholesterol: 107 mg/dL — ABNORMAL HIGH (ref 0–99)
TRIGLYCERIDES: 200 mg/dL — AB (ref <150)
Total CHOL/HDL Ratio: 6.3 RATIO
VLDL: 40 mg/dL (ref 0–40)

## 2017-04-24 LAB — PROTIME-INR
INR: 0.98
PROTHROMBIN TIME: 12.9 s (ref 11.4–15.2)

## 2017-04-24 LAB — MRSA PCR SCREENING: MRSA BY PCR: NEGATIVE

## 2017-04-24 LAB — APTT: aPTT: 31 seconds (ref 24–36)

## 2017-04-24 LAB — HEMOGLOBIN A1C
Hgb A1c MFr Bld: 5.9 % — ABNORMAL HIGH (ref 4.8–5.6)
Mean Plasma Glucose: 122.63 mg/dL

## 2017-04-24 SURGERY — LEFT HEART CATH AND CORONARY ANGIOGRAPHY
Anesthesia: LOCAL

## 2017-04-24 MED ORDER — SODIUM CHLORIDE 0.9 % IV SOLN
INTRAVENOUS | Status: AC | PRN
  Administered 2017-04-24 (×2): 1.75 mg/kg/h via INTRAVENOUS

## 2017-04-24 MED ORDER — BIVALIRUDIN TRIFLUOROACETATE 250 MG IV SOLR
INTRAVENOUS | Status: AC
  Filled 2017-04-24: qty 250

## 2017-04-24 MED ORDER — SODIUM CHLORIDE 0.9 % IV SOLN
INTRAVENOUS | Status: AC | PRN
  Administered 2017-04-24: 100 mL/h via INTRAVENOUS

## 2017-04-24 MED ORDER — NITROGLYCERIN 1 MG/10 ML FOR IR/CATH LAB
INTRA_ARTERIAL | Status: AC
  Filled 2017-04-24: qty 10

## 2017-04-24 MED ORDER — SODIUM CHLORIDE 0.9% FLUSH
3.0000 mL | Freq: Two times a day (BID) | INTRAVENOUS | Status: DC
  Administered 2017-04-25 (×2): 3 mL via INTRAVENOUS

## 2017-04-24 MED ORDER — VERAPAMIL HCL 2.5 MG/ML IV SOLN
INTRAVENOUS | Status: AC
  Filled 2017-04-24: qty 2

## 2017-04-24 MED ORDER — HYDRALAZINE HCL 20 MG/ML IJ SOLN: 5.0000 mg | INTRAMUSCULAR | Status: AC | PRN

## 2017-04-24 MED ORDER — HEPARIN (PORCINE) IN NACL 2-0.9 UNIT/ML-% IJ SOLN
INTRAMUSCULAR | Status: AC
  Filled 2017-04-24: qty 1000

## 2017-04-24 MED ORDER — IOPAMIDOL (ISOVUE-370) INJECTION 76%
INTRAVENOUS | Status: DC | PRN
  Administered 2017-04-24: 245 mL via INTRA_ARTERIAL

## 2017-04-24 MED ORDER — HEPARIN SODIUM (PORCINE) 1000 UNIT/ML IJ SOLN
INTRAMUSCULAR | Status: DC | PRN
  Administered 2017-04-24: 5000 [IU] via INTRAVENOUS

## 2017-04-24 MED ORDER — LABETALOL HCL 5 MG/ML IV SOLN: 10.0000 mg | INTRAVENOUS | Status: AC | PRN

## 2017-04-24 MED ORDER — HEPARIN (PORCINE) IN NACL 2-0.9 UNIT/ML-% IJ SOLN
INTRAMUSCULAR | Status: AC | PRN
  Administered 2017-04-24: 1000 mL

## 2017-04-24 MED ORDER — TICAGRELOR 90 MG PO TABS
ORAL_TABLET | ORAL | Status: DC | PRN
  Administered 2017-04-24: 180 mg via ORAL

## 2017-04-24 MED ORDER — SODIUM CHLORIDE 0.9% FLUSH: 3.0000 mL | INTRAVENOUS | Status: DC | PRN

## 2017-04-24 MED ORDER — MIDAZOLAM HCL 2 MG/2ML IJ SOLN
INTRAMUSCULAR | Status: DC | PRN
  Administered 2017-04-24: 2 mg via INTRAVENOUS

## 2017-04-24 MED ORDER — ALBUTEROL SULFATE (2.5 MG/3ML) 0.083% IN NEBU: 3.0000 mL | INHALATION_SOLUTION | Freq: Four times a day (QID) | RESPIRATORY_TRACT | Status: DC | PRN

## 2017-04-24 MED ORDER — ONDANSETRON HCL 4 MG/2ML IJ SOLN: 4.0000 mg | Freq: Four times a day (QID) | INTRAMUSCULAR | Status: DC | PRN

## 2017-04-24 MED ORDER — BIVALIRUDIN BOLUS VIA INFUSION - CUPID
INTRAVENOUS | Status: DC | PRN
  Administered 2017-04-24: 80.25 mg via INTRAVENOUS

## 2017-04-24 MED ORDER — FENTANYL CITRATE (PF) 100 MCG/2ML IJ SOLN
INTRAMUSCULAR | Status: DC | PRN
  Administered 2017-04-24: 25 ug via INTRAVENOUS

## 2017-04-24 MED ORDER — NITROGLYCERIN 1 MG/10 ML FOR IR/CATH LAB
INTRA_ARTERIAL | Status: DC | PRN
  Administered 2017-04-24: 200 ug

## 2017-04-24 MED ORDER — NITROGLYCERIN 0.4 MG SL SUBL: 0.4000 mg | SUBLINGUAL_TABLET | SUBLINGUAL | Status: DC | PRN

## 2017-04-24 MED ORDER — METOPROLOL TARTRATE 25 MG PO TABS
25.0000 mg | ORAL_TABLET | Freq: Two times a day (BID) | ORAL | Status: DC
  Administered 2017-04-24 – 2017-04-26 (×4): 25 mg via ORAL
  Filled 2017-04-24 (×4): qty 1

## 2017-04-24 MED ORDER — FENTANYL CITRATE (PF) 100 MCG/2ML IJ SOLN
INTRAMUSCULAR | Status: AC
  Filled 2017-04-24: qty 2

## 2017-04-24 MED ORDER — HEPARIN SODIUM (PORCINE) 1000 UNIT/ML IJ SOLN
INTRAMUSCULAR | Status: AC
  Filled 2017-04-24: qty 1

## 2017-04-24 MED ORDER — LIDOCAINE HCL (PF) 1 % IJ SOLN
INTRAMUSCULAR | Status: DC | PRN
  Administered 2017-04-24: 2 mL

## 2017-04-24 MED ORDER — IOPAMIDOL (ISOVUE-370) INJECTION 76%
INTRAVENOUS | Status: AC
  Filled 2017-04-24: qty 125

## 2017-04-24 MED ORDER — ASPIRIN 81 MG PO CHEW: 81.0000 mg | CHEWABLE_TABLET | Freq: Every day | ORAL | Status: DC

## 2017-04-24 MED ORDER — ACETAMINOPHEN 325 MG PO TABS: 650.0000 mg | ORAL_TABLET | ORAL | Status: DC | PRN

## 2017-04-24 MED ORDER — TICAGRELOR 90 MG PO TABS
90.0000 mg | ORAL_TABLET | Freq: Two times a day (BID) | ORAL | Status: DC
  Administered 2017-04-24 – 2017-04-26 (×4): 90 mg via ORAL
  Filled 2017-04-24 (×4): qty 1

## 2017-04-24 MED ORDER — IOPAMIDOL (ISOVUE-370) INJECTION 76%
INTRAVENOUS | Status: AC
  Filled 2017-04-24: qty 100

## 2017-04-24 MED ORDER — ASPIRIN EC 81 MG PO TBEC
81.0000 mg | DELAYED_RELEASE_TABLET | Freq: Every day | ORAL | Status: DC
  Administered 2017-04-25 – 2017-04-26 (×2): 81 mg via ORAL
  Filled 2017-04-24 (×2): qty 1

## 2017-04-24 MED ORDER — SODIUM CHLORIDE 0.9 % IV SOLN: 250.0000 mL | INTRAVENOUS | Status: DC | PRN

## 2017-04-24 MED ORDER — LIDOCAINE HCL (PF) 1 % IJ SOLN
INTRAMUSCULAR | Status: AC
  Filled 2017-04-24: qty 30

## 2017-04-24 MED ORDER — MIDAZOLAM HCL 2 MG/2ML IJ SOLN
INTRAMUSCULAR | Status: AC
  Filled 2017-04-24: qty 2

## 2017-04-24 MED ORDER — DIAZEPAM 5 MG PO TABS: 5.0000 mg | ORAL_TABLET | Freq: Four times a day (QID) | ORAL | Status: DC | PRN

## 2017-04-24 MED ORDER — VERAPAMIL HCL 2.5 MG/ML IV SOLN
INTRAVENOUS | Status: DC | PRN
  Administered 2017-04-24: 10 mL via INTRA_ARTERIAL

## 2017-04-24 MED ORDER — SODIUM CHLORIDE 0.9 % IV SOLN
INTRAVENOUS | Status: DC
  Administered 2017-04-25: 08:00:00 via INTRAVENOUS

## 2017-04-24 SURGICAL SUPPLY — 19 items
BALLN SAPPHIRE 2.0X12 (BALLOONS) ×2
BALLN SAPPHIRE ~~LOC~~ 2.25X8 (BALLOONS) ×2 IMPLANT
BALLOON SAPPHIRE 2.0X12 (BALLOONS) ×1 IMPLANT
CATH 5FR JL3.5 JR4 ANG PIG MP (CATHETERS) ×2 IMPLANT
CATH VISTA GUIDE 6FR XB3.5 (CATHETERS) ×2 IMPLANT
DEVICE RAD COMP TR BAND LRG (VASCULAR PRODUCTS) ×2 IMPLANT
GLIDESHEATH SLEND SS 6F .021 (SHEATH) ×2 IMPLANT
GUIDEWIRE INQWIRE 1.5J.035X260 (WIRE) ×1 IMPLANT
INQWIRE 1.5J .035X260CM (WIRE) ×2
KIT ENCORE 26 ADVANTAGE (KITS) ×2 IMPLANT
KIT HEART LEFT (KITS) ×2 IMPLANT
PACK CARDIAC CATHETERIZATION (CUSTOM PROCEDURE TRAY) ×2 IMPLANT
STENT SYNERGY DES 2.25X12 (Permanent Stent) ×2 IMPLANT
STENT SYNERGY DES 2.25X16 (Permanent Stent) ×2 IMPLANT
SYR MEDRAD MARK V 150ML (SYRINGE) IMPLANT
TRANSDUCER W/STOPCOCK (MISCELLANEOUS) ×2 IMPLANT
TUBING CIL FLEX 10 FLL-RA (TUBING) ×2 IMPLANT
WIRE HI TORQ VERSACORE-J 145CM (WIRE) ×2 IMPLANT
WIRE PT2 MS 185 (WIRE) ×2 IMPLANT

## 2017-04-24 NOTE — Progress Notes (Signed)
CRITICAL VALUE ALERT  Critical Value:  Troponin 3.89  Date & Time Notied:  1839  Provider Notified: Barrett PA  Orders Received/Actions taken: Patient resting comfortably. No complaints of chest pain or tightness, sob. No further action taken. Will continue to monitor patient closely.    Donnelly Angelica RN

## 2017-04-24 NOTE — H&P (Signed)
Cardiology Admission History and Physical:   Patient ID: [name].; MRN: [id number]; DOB: 02-07-1946   Admission date: 04/24/2017  Primary Care Provider: Enid Skeens., MD Primary Cardiologist: No primary care provider on file. Dr. Stanford Breed Primary Electrophysiologist:   none  Chief Complaint: Chest pain  Patient Profile:   [name]. is a 72 y.o. male with a history of previous PCI to RCA in 2002 with subsequent non-STEMI in 2011 with coronary disease history as described below in past medical history here with chest pain that started approximately 9 AM this morning that was the same as his prior myocardial infarction.  He stated its central chest discomfort, pressure rated as a 7/10 not relieved with nitroglycerin.  EMS arrived and his anterior/anterolateral precordial leads demonstrated ST segment elevation J-point.  No associated shortness of breath or diaphoresis.  History of Present Illness:   [name] is a 72 year old male with prior non-STEMI and coronary disease history as below with previous in-stent restenosis of distal RCA stent here with ST elevation myocardial infarction.  Chest pain began 9 AM.  Substernal.  Pressure-like.  Felt very similar to his prior.  Left arm pain as well.  No other associated symptoms.  No recent falls fevers chills nausea vomiting syncope bleeding.  He does bruise easily with the aspirin that he takes.  He is not on Plavix.  Last visit with Dr. Stanford Breed was in 2015.  Past medical history from Dr. Stanford Breed shows previous note in 2015: CAD; s/p PCI to the RCA in 2002, s/p NSTEMI 10/11 treated with a BMS to the distal RCA and DES to the PL2. Abdominal U/S 12/13: No AAA. Admitted 12/13 with UA. Echo 04/04/12: Mild LVH, EF 55-60%, normal wall motion, mildly calcified aortic valve annulus, mild RVE. LHC 04/06/12: mLAD 50%, proximal D2 60%, mCFX 70% (unchanged from previous study), mRCA 30-40%, dRCA stent patent followed by an area of  moderately severe ISR at 75%, PLA stent patent, EF 55-65%. PCI: Promus Premier DES to dRCA ISR. DAPT recommended x 1 year.   Past Medical History:  Diagnosis Date  . CAD (coronary artery disease)    a. s/p PCI to RCA 2002;  b. NSTEMI 10/11: cath with pLAD 30%, mLAD 30-40%, pCFX 20%, mCFX 60%, dCFX occluded (med Rx); pRCA 20%, dRCA 95% before stent tx with BMS, RCA stent ok, m-d PDA 90-95% (med Rx); pPL2 80% tx with DES; EF 55% c.  75% distal RCA ISR s/p DES. 70% mid LCx unchanged from prior studies. Patent prior RCA, PLA stents. Subtotal mid-dis PDA occlusion. EF  55-60%.   Marland Kitchen Hx of adenomatous and sessile serrated colonic polyps 03/14/2014  . Hx of echocardiogram    a. Echo 04/04/12: Mild LVH, EF 55-60%, normal wall motion, mildly calcified aortic valve annulus, mild RVE.  Marland Kitchen Hyperlipidemia    intolerant to statins => fenofibrate started 1/14 (Trigs > 400)  . Hypertension   . Myocardial infarction ([place])   . TIA (transient ischemic attack) 2002  . Type 2 diabetes mellitus    dx 04/2012 => metformin started    Past Surgical History:  Procedure Laterality Date  . COLONOSCOPY    . CORONARY ANGIOPLASTY  03/2012   75% distal RCA ISR s/p DES. 70% mid LCx unchanged from prior studies. Patent prior RCA, PLA stents. Subtotal mid-dis PDA occlusion. Diffuse nonobstructive disease elsewhere. LVEF 55-65%  . CORONARY ANGIOPLASTY WITH STENT PLACEMENT  /4 stents   Dist RCA 4.0 x 15 mm BMS,  PL 2.5 x 12 mm ION DES  . HERNIA REPAIR  2007   [facility]  . HERNIA REPAIR  02/04/11   BIH repair   . LEFT HEART CATHETERIZATION WITH CORONARY ANGIOGRAM Bilateral 04/06/2012   Procedure: LEFT HEART CATHETERIZATION WITH CORONARY ANGIOGRAM;  Surgeon: Sherren Mocha, MD;  Location: [facility] CATH LAB;  Service: Cardiovascular;  Laterality: Bilateral;  . PERCUTANEOUS STENT INTERVENTION Right 04/06/2012   Procedure: PERCUTANEOUS STENT INTERVENTION;  Surgeon: Sherren Mocha, MD;  Location: [facility] CATH LAB;  Service: Cardiovascular;   Laterality: Right;  des x1 distal rca     Medications Prior to Admission: Prior to Admission medications   Medication Sig Start Date End Date Taking? Authorizing Provider  albuterol (PROVENTIL HFA;VENTOLIN HFA) 108 (90 BASE) MCG/ACT inhaler Inhale 1-2 puffs into the lungs every 6 (six) hours as needed for wheezing or shortness of breath.    [provider]  amLODipine (NORVASC) 5 MG tablet TAKE 1 TABLET BY MOUTH DAILY 12/23/13   Lelon Perla, MD  aspirin 81 MG chewable tablet Chew 1 tablet (81 mg total) by mouth daily. 04/07/12   Arguello, Roger A, PA-C  hydrochlorothiazide (HYDRODIURIL) 25 MG tablet TAKE 1 TABLET BY MOUTH DAILY    Crenshaw, Denice Bors, MD  ipratropium (ATROVENT) 0.06 % nasal spray Place 2 sprays into both nostrils as needed for rhinitis.    [provider]  metFORMIN (GLUCOPHAGE) 1000 MG tablet Take 1,000 mg by mouth 2 (two) times daily with a meal.    [provider]  metoprolol tartrate (LOPRESSOR) 25 MG tablet TAKE 1 TABLET BY MOUTH TWICE DAILY 02/03/13   Lelon Perla, MD  nitroGLYCERIN (NITROSTAT) 0.4 MG SL tablet Place 1 tablet (0.4 mg total) under the tongue every 5 (five) minutes as needed. Patient not taking: Reported on 03/09/2014 12/17/11   Lelon Perla, MD     Allergies:    Allergies  Allergen Reactions  . Statins Other (See Comments)    Myalgias with 3 different statins    Social History:   Social History   Socioeconomic History  . Marital status: Married    Spouse name: Not on file  . Number of children: Not on file  . Years of education: Not on file  . Highest education level: Not on file  Social Needs  . Financial resource strain: Not on file  . Food insecurity - worry: Not on file  . Food insecurity - inability: Not on file  . Transportation needs - medical: Not on file  . Transportation needs - non-medical: Not on file  Occupational History  . Occupation: Works on Tax [employer]  Tobacco Use  . Smoking  status: Never Smoker  . Smokeless tobacco: Never Used  Substance and Sexual Activity  . Alcohol use: No  . Drug use: No  . Sexual activity: Not on file  Other Topics Concern  . Not on file  Social History Narrative   Lives with wife, is active around the house and yard. Still works. Admits he does not eat healthy food.    Family History:   The patient's family history includes Diabetes in his sister; Heart disease in his mother and sister.    ROS:  Please see the history of present illness.  All other ROS reviewed and negative.     Physical Exam/Data:  There were no vitals filed for this visit. No intake or output data in the 24 hours ending 04/24/17 1227 There were no vitals filed for this visit. There  is no height or weight on file to calculate BMI.  General:  Well nourished, well developed, in no acute distress HEENT: normal Lymph: no adenopathy Neck: no JVD Endocrine:  No thryomegaly Vascular: No carotid bruits; FA pulses 2+ bilaterally without bruits  Cardiac:  normal S1, S2; RRR; no murmur  Lungs:  clear to auscultation bilaterally, no wheezing, rhonchi or rales  Abd: soft, nontender, no hepatomegaly  Ext: no edema Musculoskeletal:  No deformities, BUE and BLE strength normal and equal Skin: warm and dry  Neuro:  CNs 2-12 intact, no focal abnormalities noted Psych:  Normal affect    EKG: Anterior precordial J-point elevation ST segment elevation.  This is different from his baseline ECG personally reviewed.  Relevant CV Studies: Prior cardiac catheterization reviewed by.  Prior office notes reviewed.  Laboratory Data:  ChemistryNo results for input(s): NA, K, CL, CO2, GLUCOSE, BUN, CREATININE, CALCIUM, GFRNONAA, GFRAA, ANIONGAP in the last 168 hours.  No results for input(s): PROT, ALBUMIN, AST, ALT, ALKPHOS, BILITOT in the last 168 hours. HematologyNo results for input(s): WBC, RBC, HGB, HCT, MCV, MCH, MCHC, RDW, PLT in the last 168 hours. Cardiac EnzymesNo  results for input(s): TROPONINI in the last 168 hours. No results for input(s): TROPIPOC in the last 168 hours.  BNPNo results for input(s): BNP, PROBNP in the last 168 hours.  DDimer No results for input(s): DDIMER in the last 168 hours.  Radiology/Studies:  No results found.  Assessment and Plan:   72 year old male with anterior ST elevation myocardial infarction.  Anterior myocardial infarction/STEMI  -Urgent cardiac catheterization, Dr. Claiborne Billings present. -Continue with aggressive secondary prevention.  Cardiac rehab.  Beta-blocker, has been intolerant to statin in the past.  We may wish to retrial, ACE inhibitor if necessary.  Prior creatinine 1.2  Severity of Illness: The appropriate patient status for this patient is INPATIENT. Inpatient status is judged to be reasonable and necessary in order to provide the required intensity of service to ensure the patient's safety. The patient's presenting symptoms, physical exam findings, and initial radiographic and laboratory data in the context of their chronic comorbidities is felt to place them at high risk for further clinical deterioration. Furthermore, it is not anticipated that the patient will be medically stable for discharge from the hospital within 2 midnights of admission. The following factors support the patient status of inpatient.   " The patient's presenting symptoms include chest pain, STEMI. " The worrisome physical exam findings include STEMI,, chest pain " The initial radiographic and laboratory data are worrisome because of anterior STEMI " The chronic co-morbidities include diabetes.   * I certify that at the point of admission it is my clinical judgment that the patient will require inpatient hospital care spanning beyond 2 midnights from the point of admission due to high intensity of service, high risk for further deterioration and high frequency of surveillance required.*    For questions or updates, please contact [place] Please consult [email or web address] for contact info under Cardiology/STEMI.    Signed, Candee Furbish, MD  04/24/2017 12:27 PM

## 2017-04-25 ENCOUNTER — Other Ambulatory Visit: Payer: Self-pay

## 2017-04-25 ENCOUNTER — Encounter (HOSPITAL_COMMUNITY): Payer: Self-pay | Admitting: Cardiovascular Disease

## 2017-04-25 ENCOUNTER — Inpatient Hospital Stay (HOSPITAL_COMMUNITY): Payer: Medicare HMO

## 2017-04-25 DIAGNOSIS — I2102 ST elevation (STEMI) myocardial infarction involving left anterior descending coronary artery: Principal | ICD-10-CM

## 2017-04-25 DIAGNOSIS — I361 Nonrheumatic tricuspid (valve) insufficiency: Secondary | ICD-10-CM

## 2017-04-25 LAB — TROPONIN I
TROPONIN I: 6 ng/mL — AB (ref <0.03)
TROPONIN I: 4.21 ng/mL — AB (ref <0.03)

## 2017-04-25 LAB — BASIC METABOLIC PANEL
ANION GAP: 12 (ref 5–15)
BUN: 19 mg/dL (ref 6–20)
CALCIUM: 8.6 mg/dL — AB (ref 8.9–10.3)
CO2: 22 mmol/L (ref 22–32)
CREATININE: 1.04 mg/dL (ref 0.61–1.24)
Chloride: 106 mmol/L (ref 101–111)
GLUCOSE: 94 mg/dL (ref 65–99)
Potassium: 3.1 mmol/L — ABNORMAL LOW (ref 3.5–5.1)
Sodium: 140 mmol/L (ref 135–145)

## 2017-04-25 LAB — ECHOCARDIOGRAM COMPLETE
Height: 72 in
Weight: 3696.67 oz

## 2017-04-25 LAB — CBC
HEMATOCRIT: 39.1 % (ref 39.0–52.0)
HEMOGLOBIN: 13.3 g/dL (ref 13.0–17.0)
MCH: 30.8 pg (ref 26.0–34.0)
MCHC: 34 g/dL (ref 30.0–36.0)
MCV: 90.5 fL (ref 78.0–100.0)
Platelets: 159 10*3/uL (ref 150–400)
RBC: 4.32 MIL/uL (ref 4.22–5.81)
RDW: 13.3 % (ref 11.5–15.5)
WBC: 5.1 10*3/uL (ref 4.0–10.5)

## 2017-04-25 LAB — LIPID PANEL
Cholesterol: 167 mg/dL (ref 0–200)
HDL: 25 mg/dL — AB (ref >40)
LDL Cholesterol: 85 mg/dL (ref 0–99)
Total CHOL/HDL Ratio: 6.7 RATIO
Triglycerides: 285 mg/dL — ABNORMAL HIGH (ref <150)
VLDL: 57 mg/dL — ABNORMAL HIGH (ref 0–40)

## 2017-04-25 MED ORDER — PRAVASTATIN SODIUM 40 MG PO TABS
80.0000 mg | ORAL_TABLET | Freq: Every day | ORAL | Status: DC
  Administered 2017-04-25: 80 mg via ORAL
  Filled 2017-04-25: qty 2

## 2017-04-25 MED ORDER — POTASSIUM CHLORIDE CRYS ER 20 MEQ PO TBCR
40.0000 meq | EXTENDED_RELEASE_TABLET | Freq: Once | ORAL | Status: AC
  Administered 2017-04-25: 40 meq via ORAL
  Filled 2017-04-25: qty 2

## 2017-04-25 NOTE — Progress Notes (Signed)
Progress Note  Patient Name: [name]. Date of Encounter: 04/25/2017  Primary Cardiologist: No primary care provider on file. Crenshaw  Subjective   Feels well, no CP, no SOB.   Inpatient Medications    Scheduled Meds: . aspirin EC  81 mg Oral Daily  . metoprolol tartrate  25 mg Oral BID  . potassium chloride  40 mEq Oral Once  . pravastatin  80 mg Oral q1800  . sodium chloride flush  3 mL Intravenous Q12H  . ticagrelor  90 mg Oral BID   Continuous Infusions: . sodium chloride 100 mL/hr at 04/25/17 0900  . sodium chloride     PRN Meds: sodium chloride, acetaminophen, albuterol, diazepam, nitroGLYCERIN, ondansetron (ZOFRAN) IV, sodium chloride flush   Vital Signs    Vitals:   04/25/17 0700 04/25/17 0800 04/25/17 0801 04/25/17 0900  BP: (!) 157/81 126/82 126/82 133/90  Pulse: 74 66 66 70  Resp: 15 19 16  (!) 27  Temp:   97.9 F (36.6 C)   TempSrc:   Oral   SpO2: 97% 96% 96% 96%  Weight:      Height:        Intake/Output Summary (Last 24 hours) at 04/25/2017 1031 Last data filed at 04/25/2017 0900 Gross per 24 hour  Intake 2057.5 ml  Output 2025 ml  Net 32.5 ml   Filed Weights   04/24/17 1300 04/24/17 1815  Weight: 235 lb (106.6 kg) 231 lb 0.7 oz (104.8 kg)    Telemetry    Sinus brady overnight - Personally Reviewed  ECG    Original ST elevation ant/lat - Personally Reviewed  Physical Exam   GEN: No acute distress.   Neck: No JVD Cardiac: RRR, no murmurs, rubs, or gallops.  Respiratory: Clear to auscultation bilaterally. GI: Soft, nontender, non-distended  MS: No edema; No deformity. Cath site c/d/i Neuro:  Nonfocal  Psych: Normal affect   Labs    Chemistry Recent Labs  Lab 04/24/17 1236 04/24/17 1245 04/25/17 0334  NA 138 140 140  K 3.3* 3.3* 3.1*  CL 103 102 106  CO2 22  --  22  GLUCOSE 107* 112* 94  BUN 25* 26* 19  CREATININE 1.14 1.10 1.04  CALCIUM 9.2  --  8.6*  PROT 6.0*  --   --   ALBUMIN 3.6  --   --   AST 21   --   --   ALT 15*  --   --   ALKPHOS 45  --   --   BILITOT 0.7  --   --   GFRNONAA >60  --  >60  GFRAA >60  --  >60  ANIONGAP 13  --  12     Hematology Recent Labs  Lab 04/24/17 1236 04/24/17 1245 04/25/17 0334  WBC 5.2  --  5.1  RBC 4.46  --  4.32  HGB 13.3 13.6 13.3  HCT 40.0 40.0 39.1  MCV 89.7  --  90.5  MCH 29.8  --  30.8  MCHC 33.3  --  34.0  RDW 13.3  --  13.3  PLT 172  --  159    Cardiac Enzymes Recent Labs  Lab 04/24/17 1236 04/24/17 1722 04/24/17 2240 04/25/17 0334  TROPONINI 0.15* 3.89* 6.00* 4.21*   No results for input(s): TROPIPOC in the last 168 hours.   BNPNo results for input(s): BNP, PROBNP in the last 168 hours.   DDimer No results for input(s): DDIMER in the last 168 hours.  Radiology    No results found.  Cardiac Studies   Cath:  Acute anterior wall ST segment elevation myocardial infarction secondary to subtotal stenoses of the mid and distal LAD.  Total occlusion of the mid distal circumflex vessel after the takeoff of a bifurcating marginal vessel, which is collateralized from the distal RCA.  Patent stents in the distal RCA before the PDA takeoff, diffuse 30-40% stenoses in the ostial and mid PDA, patent PLA stent, and evidence for distal collateralization to the circumflex vessel from the PLA vessel.  LVEDP 14 mmHg.  RECOMMENDATION: DAPT for a minimum of a year and probably indefinitely.  Medical therapy for concomitant CAD.  If patient is statin intolerance, consider Zetia and PCSK9 inhibition.   Diagnostic Diagram       Post-Intervention Diagram          Patient Profile     72 y.o. male STEMI anterior, DES mid and distal LAD  Assessment & Plan    STEMI/CAD/anterior MI  - DES x 2 to LAD mid and distal. Prior RCA stent patent. Occluded circ with collats.   - tx to tele  - ECHO  - cardiac rehab  - will try Pravastatin 80. Has had issues with Crestor/Lipitor in the past.   - May need Zetia trial in future  as well as PCSK9 inh. Discussed.    Hypokalemia  - replete  DC tomorrow  For questions or updates, please contact [place] Please consult [email or web address] for contact info under Cardiology/STEMI.      Signed, Candee Furbish, MD  04/25/2017, 10:31 AM

## 2017-04-25 NOTE — Progress Notes (Addendum)
Benefit check sent for [name], Andrew Cork  Seab Axel, RN        Brilinta 90mg . 2 times a day $47.00 30 day supply.  No Auth required.: [facility].   [place]     Verified patient's pharmacy, [facility] has it in stock.   Patient provided with Brilinta coupon. Verbalized understanding for use.

## 2017-04-25 NOTE — Progress Notes (Signed)
  Echocardiogram 2D Echocardiogram has been performed.  Merrie Roof F 04/25/2017, 2:23 PM

## 2017-04-25 NOTE — Progress Notes (Signed)
CARDIAC REHAB PHASE I   PRE:  Rate/Rhythm: 51 SR with PVC    BP: sitting 122/79    SaO2: 97 RA  MODE:  Ambulation: 660 ft   POST:  Rate/Rhythm: 82 SR    BP: sitting 164/76     SaO2: 98 RA  Tolerated very well, quick pace. Feels good. Only trouble was his hip/leg with distance (has seen orthopedist for this). BP elevated. Ed completed with pt and wife. Good reception. Understands importance of Brilinta and CM will see him later today. Will refer to [facility] however pt is not very interested in [place]. He likes to work and sts he is too busy.  Encouraged more walking later. [place], ACSM 04/25/2017 3:03 PM

## 2017-04-26 ENCOUNTER — Encounter (HOSPITAL_COMMUNITY): Payer: Self-pay | Admitting: Cardiology

## 2017-04-26 DIAGNOSIS — I2510 Atherosclerotic heart disease of native coronary artery without angina pectoris: Secondary | ICD-10-CM | POA: Diagnosis present

## 2017-04-26 DIAGNOSIS — E785 Hyperlipidemia, unspecified: Secondary | ICD-10-CM

## 2017-04-26 MED ORDER — PRAVASTATIN SODIUM 80 MG PO TABS: 80.0000 mg | ORAL_TABLET | Freq: Every evening | ORAL | 6 refills | Status: DC

## 2017-04-26 MED ORDER — NITROGLYCERIN 0.4 MG SL SUBL: 0.4000 mg | SUBLINGUAL_TABLET | SUBLINGUAL | 0 refills | Status: DC | PRN

## 2017-04-26 MED ORDER — TICAGRELOR 90 MG PO TABS: 90.0000 mg | ORAL_TABLET | Freq: Two times a day (BID) | ORAL | 3 refills | Status: DC

## 2017-04-26 MED ORDER — AMLODIPINE BESYLATE 5 MG PO TABS: 5.0000 mg | ORAL_TABLET | Freq: Every day | ORAL | Status: DC

## 2017-04-26 MED ORDER — POTASSIUM CHLORIDE CRYS ER 20 MEQ PO TBCR
40.0000 meq | EXTENDED_RELEASE_TABLET | Freq: Two times a day (BID) | ORAL | Status: DC
  Administered 2017-04-26: 40 meq via ORAL
  Filled 2017-04-26: qty 2

## 2017-04-26 NOTE — Progress Notes (Signed)
Progress Note   Subjective   Doing well today, the patient denies CP or SOB.  No new concerns  Inpatient Medications    Scheduled Meds: . [START ON 04/27/2017] amLODipine  5 mg Oral Daily  . aspirin EC  81 mg Oral Daily  . metoprolol tartrate  25 mg Oral BID  . potassium chloride  40 mEq Oral BID  . pravastatin  80 mg Oral q1800  . sodium chloride flush  3 mL Intravenous Q12H  . ticagrelor  90 mg Oral BID   Continuous Infusions: . sodium chloride     PRN Meds: sodium chloride, acetaminophen, albuterol, diazepam, nitroGLYCERIN, ondansetron (ZOFRAN) IV, sodium chloride flush   Vital Signs    Vitals:   04/26/17 1327 04/26/17 1407 04/26/17 1445 04/26/17 1446  BP:  (!) 145/69    Pulse: 63 63 69 70  Resp: (!) 21 18 (!) 23 (!) 25  Temp:      TempSrc:      SpO2: 95% 97% 95% 96%  Weight:      Height:        Intake/Output Summary (Last 24 hours) at 04/26/2017 1531 Last data filed at 04/26/2017 1234 Gross per 24 hour  Intake 460 ml  Output 500 ml  Net -40 ml   Filed Weights   04/24/17 1300 04/24/17 1815 04/26/17 0407  Weight: 235 lb (106.6 kg) 231 lb 0.7 oz (104.8 kg) 229 lb 0.9 oz (103.9 kg)    Telemetry    Sinus rhythm - Personally Reviewed  Physical Exam   GEN- The patient is well appearing, alert and oriented x 3 today.   Head- normocephalic, atraumatic Eyes-  Sclera clear, conjunctiva pink Ears- hearing intact Oropharynx- clear Neck- supple, Lungs- Clear to ausculation bilaterally, normal work of breathing Heart- Regular rate and rhythm  GI- soft, NT, ND, + BS Extremities- no clubbing, cyanosis, or edema  MS- no significant deformity or atrophy Skin- no rash or lesion Psych- euthymic mood, full affect Neuro- strength and sensation are intact   Labs    Chemistry Recent Labs  Lab 04/24/17 1236 04/24/17 1245 04/25/17 0334  NA 138 140 140  K 3.3* 3.3* 3.1*  CL 103 102 106  CO2 22  --  22  GLUCOSE 107* 112* 94  BUN 25* 26* 19  CREATININE  1.14 1.10 1.04  CALCIUM 9.2  --  8.6*  PROT 6.0*  --   --   ALBUMIN 3.6  --   --   AST 21  --   --   ALT 15*  --   --   ALKPHOS 45  --   --   BILITOT 0.7  --   --   GFRNONAA >60  --  >60  GFRAA >60  --  >60  ANIONGAP 13  --  12     Hematology Recent Labs  Lab 04/24/17 1236 04/24/17 1245 04/25/17 0334  WBC 5.2  --  5.1  RBC 4.46  --  4.32  HGB 13.3 13.6 13.3  HCT 40.0 40.0 39.1  MCV 89.7  --  90.5  MCH 29.8  --  30.8  MCHC 33.3  --  34.0  RDW 13.3  --  13.3  PLT 172  --  159    Cardiac Enzymes Recent Labs  Lab 04/24/17 1236 04/24/17 1722 04/24/17 2240 04/25/17 0334  TROPONINI 0.15* 3.89* 6.00* 4.21*   No results for input(s): TROPIPOC in the last 168 hours.      Patient Profile  72 y.o. male STEMI anterior, DES mid and distal LAD   Assessment & Plan    1.  CAD s/p anterior STEMI Doing very well  DC to home today Continue ASA and brilinta - will try Pravastatin 80. Has had issues with Crestor/Lipitor in the past.   - May need Zetia trial in future as well as PCSK9 inh. Discussed.   2. Hypertension Resume home amlodipine Given low K, would not start hctz until follow-up with PCP  3. Hypokalemia Stop hctz as above Will give Kdur 40 meq po x 1 now  4. Diabetes Resume metformin at discharge Follow-up with PCP  DC to home Will need close outpatient cardiology follow-up  Thompson Grayer MD, [facility] 04/26/2017 3:31 PM

## 2017-04-26 NOTE — Progress Notes (Signed)
Patient discharge paperwork gone over in detail with patient and his wife. All questions answered to patient's satisfaction. IV removed intact. Telemetry discontinued. Patient discharged to home with wife by way of wheelchair. VSS. Cath site level Zero.  Lucius Conn, RN

## 2017-04-26 NOTE — Discharge Summary (Signed)
Discharge Summary    Patient ID: [name].,  MRN: [id number], DOB/AGE: Oct 12, 1945 72 y.o.  Admit date: 04/24/2017 Discharge date: 04/26/2017  Primary Care Provider: Enid Skeens Primary Cardiologist: Kirk Ruths, MD  Discharge Diagnoses    Principal Problem:   Acute ST elevation myocardial infarction (STEMI) involving left anterior descending (LAD) coronary artery [facility]) Active Problems:   Hypokalemia   Chest pain   Dyslipidemia   CAD (coronary artery disease)  Allergies Allergies  Allergen Reactions  . Statins Other (See Comments)    Myalgias with 3 different statins - Crestor, atorvastatin    Diagnostic Studies/Procedures   Cath 04/24/17:    Ost 3rd Mrg to 3rd Mrg lesion is 100% stenosed.  2nd RPLB lesion is 15% stenosed.  Ost RPDA lesion is 30% stenosed.  RPDA lesion is 40% stenosed.  Dist RCA lesion is 20% stenosed.  Prox LAD to Mid LAD lesion is 99% stenosed.  Mid LAD to Dist LAD lesion is 95% stenosed.  A stent was successfully placed.  Post intervention, there is a 0% residual stenosis.  A stent was successfully placed.  Post intervention, there is a 0% residual stenosis.   Acute anterior wall ST segment elevation myocardial infarction secondary to subtotal stenoses of the mid and distal LAD.  Total occlusion of the mid distal circumflex vessel after the takeoff of a bifurcating marginal vessel, which is collateralized from the distal RCA.  Patent stents in the distal RCA before the PDA takeoff, diffuse 30-40% stenoses in the ostial and mid PDA, patent PLA stent, and evidence for distal collateralization to the circumflex vessel from the PLA vessel.  LVEDP 14 mmHg.  RECOMMENDATION: DAPT for a minimum of a year and probably indefinitely.  Medical therapy for concomitant CAD.  If patient is statin intolerance, consider Zetia and PCSK9 inhibition.   _____________  Echo 04/25/17: Study Conclusions  - Left ventricle: The  cavity size was normal. There was moderate   focal basal hypertrophy. Systolic function was normal. The   estimated ejection fraction was in the range of 60% to 65%. Wall   motion was normal; there were no regional wall motion   abnormalities. There was an increased relative contribution of   atrial contraction to ventricular filling. Doppler parameters are   consistent with abnormal left ventricular relaxation (grade 1   diastolic dysfunction). - Left atrium: The atrium was mildly dilated. - Right ventricle: The cavity size was mildly dilated. Wall   thickness was normal.  Impressions: -Normal LVF EF 60-65%, moderate BSH, mild TR, mild RVE, grade 1   DD.  History of Present Illness     [name]. is a 72 y.o. male with a history of previous PCI to RCA in 2002 with subsequent non-STEMI in 2011 with coronary disease history as described below in past medical history here with chest pain that started approximately 9 AM this morning that was the same as his prior myocardial infarction. He stated its central chest discomfort, pressure rated as a 7/10 not relieved with nitroglycerin. EMS arrived and his anterior/anterolateral precordial leads demonstrated ST segment elevation J-point. No associated shortness of breath or diaphoresis.  Past medical history from Dr. Stanford Breed shows previous note in 2015: CAD; s/p PCI to the RCA in 2002, s/p NSTEMI 10/11 treated with a BMS to the distal RCA and DES to the PL2. Abdominal U/S 12/13: No AAA. Admitted 12/13 with UA. Echo 04/04/12: Mild LVH, EF 55-60%, normal wall motion, mildly  calcified aortic valve annulus, mild RVE. LHC 04/06/12: mLAD 50%, proximal D2 60%, mCFX 70% (unchanged from previous study), mRCA 30-40%, dRCA stent patent followed by an area of moderately severe ISR at 75%, PLA stent patent, EF 55-65%. PCI: Promus Premier DES to dRCA ISR. DAPT recommended x 1 year.  Hospital Course   The pt was taken to the cardiac cath lab urgently on  04/24/17 where 2x DES stents were placed in the mid and distal LAD. Prior RCA stents were found to be widely patent. An echocardiogram was completed that showed an EF of 60-65%. His troponin levels are downward trending, 3.89>6.00>4.21. He has been stable since his cath. Cardiac rehabilitation has worked with the patient without complication.   The plan is to keep the pt on ASA and Brilinta for at least a year. Medical therapy for concomitant CAD. Will start on Pravastatin. If pt  has intolerance to statin, consider Zetia and PCSK9 inhibitor.    Consultants: None   The patient was seen and examined by Dr. Rayann Heman who feel that he is stable and ready for discharge on 04/26/17  Message was sent for follow up [place] appointment.  _____________  Discharge Vitals Blood pressure (!) 142/73, pulse 64, temperature 98.1 F (36.7 C), temperature source Oral, resp. rate 16, height 6' (1.829 m), weight 229 lb 0.9 oz (103.9 kg), SpO2 97 %.  Filed Weights   04/24/17 1300 04/24/17 1815 04/26/17 0407  Weight: 235 lb (106.6 kg) 231 lb 0.7 oz (104.8 kg) 229 lb 0.9 oz (103.9 kg)    Labs & Radiologic Studies    CBC Recent Labs    04/24/17 1236 04/24/17 1245 04/25/17 0334  WBC 5.2  --  5.1  HGB 13.3 13.6 13.3  HCT 40.0 40.0 39.1  MCV 89.7  --  90.5  PLT 172  --  979   Basic Metabolic Panel Recent Labs    04/24/17 1236 04/24/17 1245 04/25/17 0334  NA 138 140 140  K 3.3* 3.3* 3.1*  CL 103 102 106  CO2 22  --  22  GLUCOSE 107* 112* 94  BUN 25* 26* 19  CREATININE 1.14 1.10 1.04  CALCIUM 9.2  --  8.6*   Liver Function Tests Recent Labs    04/24/17 1236  AST 21  ALT 15*  ALKPHOS 45  BILITOT 0.7  PROT 6.0*  ALBUMIN 3.6   Cardiac Enzymes Recent Labs    04/24/17 1722 04/24/17 2240 04/25/17 0334  TROPONINI 3.89* 6.00* 4.21*   Hemoglobin A1C Recent Labs    04/24/17 1236  HGBA1C 5.9*   Fasting Lipid Panel Recent Labs    04/25/17 0334  CHOL 167  HDL 25*  LDLCALC 85  TRIG  285*  CHOLHDL 6.7   _____________  No results found. Disposition   Pt is being discharged home today in good condition.  Follow-up Plans & Appointments    Follow-up Information    [facility] Follow up.   Specialty:  Cardiology Why:  Someone from the office will call you with an appointment for follow up with Dr. Stanford Breed or one of his colleagues.  Contact information: [address] [place] [phone]         Discharge Instructions    Amb Referral to Cardiac Rehabilitation   Complete by:  As directed    Diagnosis:   STEMI PTCA Coronary Stents     Diet - low sodium heart healthy   Complete by:  As directed  Discharge instructions   Complete by:  As directed    No driving for 2 weeks. No lifting over 10 lbs for 4 weeks. No sexual activity for 4 weeks. You may not return to work until cleared by your cardiologist, if applicable. Keep procedure site clean & dry. If you notice increased pain, swelling, bleeding or pus, call/return!  You may shower, but no soaking baths/hot tubs/pools for 1 week.   1. Restart your Metformin tomorrow, 04/27/17 2. Your hydrochlorothiazide was stopped because of low potassium 3. Patients taking blood thinners should generally stay away from medicines like ibuprofen, Advil, Motrin, naproxen, and Aleve due to risk of stomach bleeding. You may take Tylenol as directed or talk to your primary doctor about alternatives. 4. Albuterol was removed from your medication list because you indicated that your were not taking it.   Increase activity slowly   Complete by:  As directed       Discharge Medications   Allergies as of 04/26/2017      Reactions   Statins Other (See Comments)   Myalgias with 3 different statins - Crestor, atorvastatin      Medication List    STOP taking these medications   hydrochlorothiazide 25 MG tablet Commonly known as:  HYDRODIURIL   ibuprofen 200 MG  tablet Commonly known as:  ADVIL,MOTRIN     TAKE these medications   amLODipine 5 MG tablet Commonly known as:  NORVASC TAKE 1 TABLET BY MOUTH DAILY What changed:    how much to take  how to take this  when to take this   aspirin 81 MG chewable tablet Chew 1 tablet (81 mg total) by mouth daily.   metFORMIN 1000 MG tablet Commonly known as:  GLUCOPHAGE Take 1,000 mg by mouth 2 (two) times daily with a meal. Notes to patient:  Restart your Metformin tomorrow, 04/27/17   metoprolol tartrate 25 MG tablet Commonly known as:  LOPRESSOR TAKE 1 TABLET BY MOUTH TWICE DAILY What changed:    how much to take  how to take this  when to take this   multivitamin with minerals Tabs tablet Take 1 tablet by mouth daily.   nitroGLYCERIN 0.4 MG SL tablet Commonly known as:  NITROSTAT Place 1 tablet (0.4 mg total) under the tongue every 5 (five) minutes as needed. What changed:  reasons to take this   pravastatin 80 MG tablet Commonly known as:  PRAVACHOL Take 1 tablet (80 mg total) by mouth every evening.   ticagrelor 90 MG Tabs tablet Commonly known as:  BRILINTA Take 1 tablet (90 mg total) by mouth 2 (two) times daily.        Aspirin prescribed at discharge?  Yes High Intensity Statin Prescribed? (Lipitor 40-80mg  or Crestor 20-40mg ): Yes Beta Blocker Prescribed? Yes For EF <40%, was ACEI/ARB Prescribed? No: EF 60-65% ADP Receptor Inhibitor Prescribed? (i.e. Plavix etc.-Includes Medically Managed Patients): Yes For EF <40%, Aldosterone Inhibitor Prescribed? No: NA, EF 60-65% Was EF assessed during THIS hospitalization? Yes Was Cardiac Rehab II ordered? (Included Medically managed Patients): Yes   Outstanding Labs/Studies    Duration of Discharge Encounter   Greater than 30 minutes including physician time.  Signed, Kathyrn Drown NP 04/26/2017, 4:30 PM   Thompson Grayer MD, [facility]

## 2017-04-30 ENCOUNTER — Telehealth: Payer: Self-pay | Admitting: *Deleted

## 2017-04-30 NOTE — Telephone Encounter (Signed)
-----   Message from Roland Earl sent at 04/30/2017  3:00 PM EST -----  Needs TOC call ----- Message ----- From: Tommie Raymond, NP Sent: 04/26/2017   4:06 PM To: Cv Div [facility] Scheduling, [facility]  Pt will need a follow up [facility] appointment with Dr. Stanford Breed or APP and a TOC phone call. Thank you!

## 2017-04-30 NOTE — Telephone Encounter (Signed)
Left message to call back  

## 2017-05-01 NOTE — Telephone Encounter (Signed)
Patient contacted regarding discharge from [facility]  on 04/26/17.  Patient understands to follow up with provider K . LAWRENCE on1/29/19  at 9:30 at [facility]. Patient understands discharge instructions? yes Patient understands medications and regiment? yes  Patient understands to bring all medications to this visit? yes

## 2017-05-06 ENCOUNTER — Ambulatory Visit: Payer: Medicare HMO | Admitting: Adult Health

## 2017-05-06 ENCOUNTER — Encounter: Payer: Self-pay | Admitting: Adult Health

## 2017-05-06 VITALS — BP 120/82 | HR 64 | Ht 72.0 in | Wt 235.0 lb

## 2017-05-06 DIAGNOSIS — I2510 Atherosclerotic heart disease of native coronary artery without angina pectoris: Secondary | ICD-10-CM | POA: Diagnosis not present

## 2017-05-06 DIAGNOSIS — I10 Essential (primary) hypertension: Secondary | ICD-10-CM

## 2017-05-06 DIAGNOSIS — E7800 Pure hypercholesterolemia, unspecified: Secondary | ICD-10-CM | POA: Diagnosis not present

## 2017-05-06 NOTE — Progress Notes (Signed)
Cardiology Office Note   Date:  05/06/2017   ID:  Andrew Asal., DOB 1946-03-16, MRN [id number]  PCP:  Enid Skeens., MD  Cardiologist:  Dr. Stanford Breed Chief Complaint  Patient presents with  . Coronary Artery Disease  . Hospitalization Follow-up     History of Present Illness: [name]. is a 72 y.o. male who presents for posthospitalization follow-up after admission for acute STEMI involving the left anterior descending artery, with other history to include CAD with prior stents to distal RCA before the PDA takeoff, PLA; dyslipidemia hypokalemia noted during hospitalization.  The patient presented with chest pain similar to pain he had with his prior myocardial infarctions.  He was taken for immediate cardiac catheterization in the setting of ST elevated MI, where he was found to have 99% proximal to mid LAD stenosis with mid to distal LAD stenosis at 95%, total occlusion of the mid distal circumflex after the takeoff of the bifurcating marginal vessel.  The patient required drug-eluting stent to the LAD.  He was recommended for dual antiplatelet therapy for minimum of 1 year but consider longer with multiple stents.  Echocardiogram was reassuring revealing no wall motion abnormalities, LVEF of 60% to 65%.  He comes today with multiple questions.  He is doing well, he denies recurrence of any symptoms.  Past Medical History:  Diagnosis Date  . CAD (coronary artery disease)    a. s/p PCI to RCA 2002;  b. NSTEMI 10/11: cath with pLAD 30%, mLAD 30-40%, pCFX 20%, mCFX 60%, dCFX occluded (med Rx); pRCA 20%, dRCA 95% before stent tx with BMS, RCA stent ok, m-d PDA 90-95% (med Rx); pPL2 80% tx with DES; EF 55% c.  75% distal RCA ISR s/p DES. 70% mid LCx unchanged from prior studies. Patent prior RCA, PLA stents. Subtotal mid-dis PDA occlusion. EF  55-60%. d.) DES x2 placed to  . Hx of adenomatous and sessile serrated colonic polyps 03/14/2014  . Hx of echocardiogram    a. Echo  04/04/12: Mild LVH, EF 55-60%, normal wall motion, mildly calcified aortic valve annulus, mild RVE.  Marland Kitchen Hyperlipidemia    intolerant to statins => fenofibrate started 1/14 (Trigs > 400)  . Hypertension   . Myocardial infarction ([place])   . STEMI (ST elevation myocardial infarction) ([place])    a.) DES x2 placed to mid and distal LAD, patent RCA stent. EF 60-65% (04/24/17)  . TIA (transient ischemic attack) 2002  . Type 2 diabetes mellitus ([place])    dx 04/2012 => metformin started    Past Surgical History:  Procedure Laterality Date  . COLONOSCOPY    . CORONARY ANGIOPLASTY  03/2012   75% distal RCA ISR s/p DES. 70% mid LCx unchanged from prior studies. Patent prior RCA, PLA stents. Subtotal mid-dis PDA occlusion. Diffuse nonobstructive disease elsewhere. LVEF 55-65%  . CORONARY ANGIOPLASTY WITH STENT PLACEMENT  /4 stents   Dist RCA 4.0 x 15 mm BMS, PL 2.5 x 12 mm ION DES  . CORONARY/GRAFT ACUTE MI REVASCULARIZATION N/A 04/24/2017   Procedure: Coronary/Graft Acute MI Revascularization;  Surgeon: Troy Sine, MD;  Location: [place] CV LAB;  Service: Cardiovascular;  Laterality: N/A;  . HERNIA REPAIR  2007   [place]  . HERNIA REPAIR  02/04/11   BIH repair   . LEFT HEART CATH AND CORONARY ANGIOGRAPHY N/A 04/24/2017   Procedure: LEFT HEART CATH AND CORONARY ANGIOGRAPHY;  Surgeon: Troy Sine, MD;  Location: [place] CV LAB;  Service: Cardiovascular;  Laterality: N/A;  . LEFT HEART CATHETERIZATION WITH CORONARY ANGIOGRAM Bilateral 04/06/2012   Procedure: LEFT HEART CATHETERIZATION WITH CORONARY ANGIOGRAM;  Surgeon: Sherren Mocha, MD;  Location: [facility] CATH LAB;  Service: Cardiovascular;  Laterality: Bilateral;  . PERCUTANEOUS STENT INTERVENTION Right 04/06/2012   Procedure: PERCUTANEOUS STENT INTERVENTION;  Surgeon: Sherren Mocha, MD;  Location: [facility] CATH LAB;  Service: Cardiovascular;  Laterality: Right;  des x1 distal rca     Current Outpatient Medications  Medication Sig Dispense Refill    . amLODipine (NORVASC) 5 MG tablet TAKE 1 TABLET BY MOUTH DAILY 90 tablet 0  . aspirin 81 MG chewable tablet Chew 1 tablet (81 mg total) by mouth daily.    . metFORMIN (GLUCOPHAGE) 1000 MG tablet Take 1,000 mg by mouth 2 (two) times daily with a meal.    . metoprolol tartrate (LOPRESSOR) 25 MG tablet TAKE 1 TABLET BY MOUTH TWICE DAILY 60 tablet 10  . Multiple Vitamin (MULTIVITAMIN WITH MINERALS) TABS tablet Take 1 tablet by mouth daily.    . nitroGLYCERIN (NITROSTAT) 0.4 MG SL tablet Place 1 tablet (0.4 mg total) under the tongue every 5 (five) minutes as needed for chest pain. 25 tablet 0  . pravastatin (PRAVACHOL) 80 MG tablet Take 1 tablet (80 mg total) by mouth every evening. 30 tablet 6  . ticagrelor (BRILINTA) 90 MG TABS tablet Take 1 tablet (90 mg total) by mouth 2 (two) times daily. 180 tablet 3   No current facility-administered medications for this visit.     Allergies:   Statins    Social History:  The patient  reports that  has never smoked. he has never used smokeless tobacco. He reports that he does not drink alcohol or use drugs.   Family History:  The patient's family history includes Diabetes in his sister; Heart disease in his mother and sister.    ROS: All other systems are reviewed and negative. Unless otherwise mentioned in H&P    PHYSICAL EXAM: VS:  BP 120/82   Pulse 64   Ht 6' (1.829 m)   Wt 235 lb (106.6 kg)   BMI 31.87 kg/m  , BMI Body mass index is 31.87 kg/m. GEN: Well nourished, well developed, in no acute distress  HEENT: normal  Neck: no JVD, carotid bruits, or masses Cardiac: RRR; no murmurs, rubs, or gallops,no edema  Respiratory:  clear to auscultation bilaterally, normal work of breathing GI: soft, nontender, nondistended, + BS MS: no deformity or atrophy right wrist cardiac catheterization insertion site is well-healed without evidence of bleeding hematoma or infection Skin: warm and dry, no rash Neuro:  Strength and sensation are  intact Psych: euthymic mood, full affect   EKG: PR interval 0.25 heart rate of 64 bpm  Recent Labs: 04/24/2017: ALT 15 04/25/2017: BUN 19; Creatinine, Ser 1.04; Hemoglobin 13.3; Platelets 159; Potassium 3.1; Sodium 140    Lipid Panel    Component Value Date/Time   CHOL 167 04/25/2017 0334   TRIG 285 (H) 04/25/2017 0334   HDL 25 (L) 04/25/2017 0334   CHOLHDL 6.7 04/25/2017 0334   VLDL 57 (H) 04/25/2017 0334   LDLCALC 85 04/25/2017 0334   LDLDIRECT 83.8 01/22/2013 0838      Wt Readings from Last 3 Encounters:  05/06/17 235 lb (106.6 kg)  04/26/17 229 lb 0.9 oz (103.9 kg)  03/09/14 265 lb (120.2 kg)      Other studies Reviewed: Cath 04/24/17:    Ost 3rd Mrg to 3rd Mrg lesion is 100% stenosed.  2nd  RPLB lesion is 15% stenosed.  Ost RPDA lesion is 30% stenosed.  RPDA lesion is 40% stenosed.  Dist RCA lesion is 20% stenosed.  Prox LAD to Mid LAD lesion is 99% stenosed.  Mid LAD to Dist LAD lesion is 95% stenosed.  A stent was successfully placed.  Post intervention, there is a 0% residual stenosis.  A stent was successfully placed.  Post intervention, there is a 0% residual stenosis.   Acute anterior wall ST segment elevation myocardial infarction secondary to subtotal stenoses of the mid and distal LAD.  Total occlusion of the mid distal circumflex vessel after the takeoff of a bifurcating marginal vessel, which is collateralized from the distal RCA.  Patent stents in the distal RCA before the PDA takeoff, diffuse 30-40% stenoses in the ostial and mid PDA, patent PLA stent, and evidence for distal collateralization to the circumflex vessel from the PLA vessel.    ASSESSMENT AND PLAN:  1.  Coronary artery disease: The patient has recently been hospitalized requiring stents to the left anterior descending artery.  He did have a patent stent to the previously placed stent to the distal RCA.  I reviewed his cardiac catheterization report with him, provided  him with printed illustration, answered multiple questions about medications, lifestyle changes, and exercise.  I have advised him to attend cardiac rehab phase II. the patient is to continue current medication regimen to include metoprolol, ticagrelor, and aspirin.  2.  Hypertension: He will continue blood pressure control with current medication regimen.  Blood pressure is stable.  He is to avoid salted foods and stay on a heart healthy diet.  This has been discussed.  3.  Hypercholesterolemia: We will continue pravastatin.  Will have follow-up lipids and LFTs in 3 months prior to follow-up appointment.  Current medicines are reviewed at length with the patient today.    Labs/ tests ordered today include: Fasting lipids and LFTs 3 months prior to follow-up appointment   Phill Myron. West Pugh, ANP, AACC   05/06/2017 12:09 PM    [facility] [address], [place], [place] [address] Phone: [phone]; Fax: [phone]

## 2017-05-06 NOTE — Patient Instructions (Signed)
Medication Instructions:  Your physician recommends that you continue on your current medications as directed. Please refer to the Current Medication list given to you today.   Follow-Up: Your physician recommends that you schedule a follow-up appointment in: 3 months with Dr. Stanford Breed   If you need a refill on your cardiac medications before your next appointment, please call your pharmacy.

## 2017-06-16 ENCOUNTER — Telehealth: Payer: Self-pay | Admitting: *Deleted

## 2017-06-16 NOTE — Telephone Encounter (Signed)
PRIMARY CARDIOLOGIST DR Stanford Breed.     [facility] Pre-operative Risk Assessment    Request for surgical clearance:  1. What type of surgery is being performed? EPIDURAL STR=ERIOD INJECTIONS  2. When is this surgery scheduled? TBA - URGENT  3. What type of clearance is required (medical clearance vs. Pharmacy clearance to hold med vs. Both)?MEDICAL  4. Are there any medications that need to be held prior to surgery and how long?BRILINTA  1 WEEK PRIOR TO INJECTIONS   5. Practice name and name of physician performing surgery? [facility]  6. What is your office phone and fax number? PHONE  [phone]    FAX [phone]  7. Anesthesia type (None, local, MAC, general) ? UNKNOWN   Raiford Simmonds 06/16/2017, 2:41 PM  _________________________________________________________________   (provider comments below)

## 2017-06-16 NOTE — Telephone Encounter (Signed)
   Primary Cardiologist: Kirk Ruths, MD   Pt is NOT CLEARED for epidural injection.   Chart reviewed as part of pre-operative protocol coverage. Pt recently admitted for acute anterior wall STEMI 04/2017 and underwent drug eluting stent placement x 2 to the mid and distal LAD. Therefore based on guidelines, the patient will need to continue dual antiplatelet therapy w/ ASA and Brilinta, w/o interruption, for a minimum of 12 months, thus pt is unable to have epidural injection at this time. All elective procedures will need to be delayed until 2020, as pt cannot stop Brilinta at this time, as premature discontinuation will jeopardize the newly placed stents and increase risk of acute InStent thrombosis/ recurrent MI.   I will route info to [facility] at fax # listed below.   Lyda Jester, PA-C 06/16/2017, 3:04 PM

## 2017-07-17 ENCOUNTER — Ambulatory Visit: Payer: Medicare HMO | Admitting: Internal Medicine

## 2017-08-07 ENCOUNTER — Ambulatory Visit: Payer: Medicare HMO | Admitting: Cardiology

## 2018-04-21 ENCOUNTER — Other Ambulatory Visit (HOSPITAL_COMMUNITY): Payer: Self-pay | Admitting: Cardiology

## 2018-05-01 ENCOUNTER — Encounter: Payer: Self-pay | Admitting: Medical

## 2018-05-01 ENCOUNTER — Encounter (INDEPENDENT_AMBULATORY_CARE_PROVIDER_SITE_OTHER): Payer: Self-pay

## 2018-05-01 ENCOUNTER — Ambulatory Visit: Payer: Medicare HMO | Admitting: Medical

## 2018-05-01 VITALS — BP 154/84 | HR 67 | Ht 72.0 in | Wt 240.2 lb

## 2018-05-01 DIAGNOSIS — E785 Hyperlipidemia, unspecified: Secondary | ICD-10-CM

## 2018-05-01 DIAGNOSIS — I2510 Atherosclerotic heart disease of native coronary artery without angina pectoris: Secondary | ICD-10-CM

## 2018-05-01 DIAGNOSIS — I10 Essential (primary) hypertension: Secondary | ICD-10-CM | POA: Diagnosis not present

## 2018-05-01 DIAGNOSIS — E119 Type 2 diabetes mellitus without complications: Secondary | ICD-10-CM | POA: Diagnosis not present

## 2018-05-01 LAB — COMPREHENSIVE METABOLIC PANEL
ALBUMIN: 4.4 g/dL (ref 3.7–4.7)
ALT: 13 IU/L (ref 0–44)
AST: 18 IU/L (ref 0–40)
Albumin/Globulin Ratio: 1.8 (ref 1.2–2.2)
Alkaline Phosphatase: 86 IU/L (ref 39–117)
BILIRUBIN TOTAL: 0.3 mg/dL (ref 0.0–1.2)
BUN / CREAT RATIO: 10 (ref 10–24)
BUN: 11 mg/dL (ref 8–27)
CALCIUM: 10 mg/dL (ref 8.6–10.2)
CO2: 22 mmol/L (ref 20–29)
CREATININE: 1.15 mg/dL (ref 0.76–1.27)
Chloride: 102 mmol/L (ref 96–106)
GFR calc non Af Amer: 62 mL/min/{1.73_m2} (ref >59)
GFR, EST AFRICAN AMERICAN: 72 mL/min/{1.73_m2} (ref >59)
Globulin, Total: 2.4 g/dL (ref 1.5–4.5)
Glucose: 94 mg/dL (ref 65–99)
Potassium: 4.8 mmol/L (ref 3.5–5.2)
Sodium: 142 mmol/L (ref 134–144)
TOTAL PROTEIN: 6.8 g/dL (ref 6.0–8.5)

## 2018-05-01 LAB — LIPID PANEL
Chol/HDL Ratio: 5.1 ratio — ABNORMAL HIGH (ref 0.0–5.0)
Cholesterol, Total: 152 mg/dL (ref 100–199)
HDL: 30 mg/dL — AB (ref >39)
LDL CALC: 80 mg/dL (ref 0–99)
Triglycerides: 209 mg/dL — ABNORMAL HIGH (ref 0–149)
VLDL CHOLESTEROL CAL: 42 mg/dL — AB (ref 5–40)

## 2018-05-01 MED ORDER — NITROGLYCERIN 0.4 MG SL SUBL: 0.4000 mg | SUBLINGUAL_TABLET | SUBLINGUAL | 3 refills | Status: DC | PRN

## 2018-05-01 MED ORDER — CLOPIDOGREL BISULFATE 75 MG PO TABS: 75.0000 mg | ORAL_TABLET | Freq: Every day | ORAL | 6 refills | Status: DC

## 2018-05-01 MED ORDER — EZETIMIBE 10 MG PO TABS: 10.0000 mg | ORAL_TABLET | Freq: Every day | ORAL | 6 refills | Status: AC

## 2018-05-01 NOTE — Progress Notes (Signed)
Cardiology Office Note   Date:  05/01/2018   ID:  Andrew Luczak., DOB [birth year], MRN [id number]  PCP:  Enid Skeens., MD  Cardiologist:  Kirk Ruths, MD EP: None  Chief Complaint  Patient presents with  . Follow-up    CAD      History of Present Illness: Andrew Romig. is a 73 y.o. male with PMH of CAD s/p STEMI with PCI/DES to LAD 04/2017 with prior stenting to dRCA and PLA, HTN, HLD, TIA, DM type 2, who presents for routine follow-up.   Patient was last seen by cardiology, Jory Sims NP, outpatient 04/2017 for post hospital follow-up of a STEMI earlier that month. He underwent a LHC 04/24/2017 with subtotal stenosis of the m-dLAD managed with PCI/DES, CTO of the m-dLCx with collaterals from dRCA, with patent dRCA and PLA stents. He was recommended for DAPT for a minimum of 1 year and probably indefinitely. Echo 04/2017 with EF 60-65% and G1DD. He was recommended to follow-up with Dr. Stanford Breed in 3 months, however has not been seen since that time.   He returns today for routine follow-up of his CAD. He reports doing quite well over the past year. 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 No complaints of chest pain, SOB, DOE, palpitations, orthopnea, PND, LE edema, dizziness, lightheadedness, or syncope. He reports his blood pressure is unusually elevated today and typically runs in the 120s/80s. He does report eating out frequently with his grandchildren. Only complaints today include muscle aches associated with pravastatin, for which he stopped this medication 1 month ago with improvement, and significant increase in the cost of his brilinta ($900 for 3 month supply).    Past Medical History:  Diagnosis Date  . CAD (coronary artery disease)    a. s/p PCI to RCA 2002;  b. NSTEMI 10/11: cath with pLAD 30%, mLAD 30-40%, pCFX 20%, mCFX 60%, dCFX occluded (med Rx); pRCA 20%, dRCA 95% before stent tx with BMS,  RCA stent ok, m-d PDA 90-95% (med Rx); pPL2 80% tx with DES; EF 55% c.  75% distal RCA ISR s/p DES. 70% mid LCx unchanged from prior studies. Patent prior RCA, PLA stents. Subtotal mid-dis PDA occlusion. EF  55-60%. d.) DES x2 placed to  . Hx of adenomatous and sessile serrated colonic polyps 03/14/2014  . Hx of echocardiogram    a. Echo 04/04/12: Mild LVH, EF 55-60%, normal wall motion, mildly calcified aortic valve annulus, mild RVE.  Marland Kitchen Hyperlipidemia    intolerant to statins => fenofibrate started 1/14 (Trigs > 400)  . Hypertension   . Myocardial infarction ([place])   . STEMI (ST elevation myocardial infarction) ([place])    a.) DES x2 placed to mid and distal LAD, patent RCA stent. EF 60-65% (04/24/17)  . TIA (transient ischemic attack) 2002  . Type 2 diabetes mellitus ([place])    dx 04/2012 => metformin started    Past Surgical History:  Procedure Laterality Date  . COLONOSCOPY    . CORONARY ANGIOPLASTY  03/2012   75% distal RCA ISR s/p DES. 70% mid LCx unchanged from prior studies. Patent prior RCA, PLA stents. Subtotal mid-dis PDA occlusion. Diffuse nonobstructive disease elsewhere. LVEF 55-65%  . CORONARY ANGIOPLASTY WITH STENT PLACEMENT  /4 stents   Dist RCA 4.0 x 15 mm BMS, PL 2.5 x 12 mm ION DES  . CORONARY/GRAFT ACUTE MI REVASCULARIZATION N/A 04/24/2017   Procedure: Coronary/Graft Acute MI  Revascularization;  Surgeon: Troy Sine, MD;  Location: [facility];  Service: Cardiovascular;  Laterality: N/A;  . HERNIA REPAIR  2007   [place]  . HERNIA REPAIR  02/04/11   BIH repair   . LEFT HEART CATH AND CORONARY ANGIOGRAPHY N/A 04/24/2017   Procedure: LEFT HEART CATH AND CORONARY ANGIOGRAPHY;  Surgeon: Troy Sine, MD;  Location: [facility];  Service: Cardiovascular;  Laterality: N/A;  . LEFT HEART CATHETERIZATION WITH CORONARY ANGIOGRAM Bilateral 04/06/2012   Procedure: LEFT HEART CATHETERIZATION WITH CORONARY ANGIOGRAM;  Surgeon: Sherren Mocha, MD;  Location: [facility];   Service: Cardiovascular;  Laterality: Bilateral;  . PERCUTANEOUS STENT INTERVENTION Right 04/06/2012   Procedure: PERCUTANEOUS STENT INTERVENTION;  Surgeon: Sherren Mocha, MD;  Location: [facility] C[facility] LAB;  Service: Cardiovascular;  Laterality: Right;  des x1 distal rca     Current Outpatient Medications  Medication Sig Dispense Refill  . amLODipine (NORVASC) 5 MG tablet TAKE 1 TABLET BY MOUTH DAILY 90 tablet 0  . aspirin 81 MG chewable tablet Chew 1 tablet (81 mg total) by mouth daily.    . metFORMIN (GLUCOPHAGE) 1000 MG tablet Take 1,000 mg by mouth 2 (two) times daily with a meal.    . metoprolol tartrate (LOPRESSOR) 25 MG tablet TAKE 1 TABLET BY MOUTH TWICE DAILY 60 tablet 10  . Multiple Vitamin (MULTIVITAMIN WITH MINERALS) TABS tablet Take 1 tablet by mouth daily.    . clopidogrel (PLAVIX) 75 MG tablet Take 1 tablet (75 mg total) by mouth daily. 30 tablet 6  . ezetimibe (ZETIA) 10 MG tablet Take 1 tablet (10 mg total) by mouth daily. 30 tablet 6  . nitroGLYCERIN (NITROSTAT) 0.4 MG SL tablet Place 1 tablet (0.4 mg total) under the tongue every 5 (five) minutes as needed for chest pain. 25 tablet 3   No current facility-administered medications for this visit.     Allergies:   Statins    Social History:  The patient  reports that he has never smoked. He has never used smokeless tobacco. He reports that he does not drink alcohol or use drugs.   Family History:  The patient's family history includes Diabetes in his sister; Heart disease in his mother and sister.    ROS:  Please see the history of present illness.   Otherwise, review of systems are positive for none.   All other systems are reviewed and negative.    PHYSICAL EXAM: VS:  BP (!) 154/84   Pulse 67   Ht 6' (1.829 m)   Wt 240 lb 3.2 oz (109 kg)   BMI 32.58 kg/m  , BMI Body mass index is 32.58 kg/m. GEN: Well nourished, well developed, in no acute distress HEENT: sclera anicteric Neck: no JVD, carotid bruits, or  masses Cardiac: RRR; no murmurs, rubs, or gallops, no edema  Respiratory:  clear to auscultation bilaterally, normal work of breathing GI: soft, nontender, nondistended, + BS MS: no deformity or atrophy Skin: warm and dry, no rash Neuro:  Strength and sensation are intact Psych: euthymic mood, full affect   EKG:  EKG is ordered today. The ekg ordered today demonstrates sinus rhythm with 1st degree AV block, no STE/D, no TWI.    Recent Labs: No results found for requested labs within last 8760 hours.    Lipid Panel    Component Value Date/Time   CHOL 167 04/25/2017 0334   TRIG 285 (H) 04/25/2017 0334   HDL 25 (L) 04/25/2017 4696  CHOLHDL 6.7 04/25/2017 0334   VLDL 57 (H) 04/25/2017 0334   LDLCALC 85 04/25/2017 0334   LDLDIRECT 83.8 01/22/2013 0838      Wt Readings from Last 3 Encounters:  05/01/18 240 lb 3.2 oz (109 kg)  05/06/17 235 lb (106.6 kg)  04/26/17 229 lb 0.9 oz (103.9 kg)      Other studies Reviewed: Additional studies/ records that were reviewed today include:   Echocardiogram 04/2017: Study Conclusions  - Left ventricle: The cavity size was normal. There was moderate   focal basal hypertrophy. Systolic function was normal. The   estimated ejection fraction was in the range of 60% to 65%. Wall   motion was normal; there were no regional wall motion   abnormalities. There was an increased relative contribution of   atrial contraction to ventricular filling. Doppler parameters are   consistent with abnormal left ventricular relaxation (grade 1   diastolic dysfunction). - Left atrium: The atrium was mildly dilated. - Right ventricle: The cavity size was mildly dilated. Wall   thickness was normal.  Impressions:  - normal LVF EF 60-65%, moderate BSH, mild TR, mild RVE, grade 1   DD.  Left heart catheterization 04/2017:  Ost 3rd Mrg to 3rd Mrg lesion is 100% stenosed.  2nd RPLB lesion is 15% stenosed.  Ost RPDA lesion is 30% stenosed.  RPDA  lesion is 40% stenosed.  Dist RCA lesion is 20% stenosed.  Prox LAD to Mid LAD lesion is 99% stenosed.  Mid LAD to Dist LAD lesion is 95% stenosed.  A stent was successfully placed.  Post intervention, there is a 0% residual stenosis.  A stent was successfully placed.  Post intervention, there is a 0% residual stenosis.   Acute anterior wall ST segment elevation myocardial infarction secondary to subtotal stenoses of the mid and distal LAD.  Total occlusion of the mid distal circumflex vessel after the takeoff of a bifurcating marginal vessel, which is collateralized from the distal RCA.  Patent stents in the distal RCA before the PDA takeoff, diffuse 30-40% stenoses in the ostial and mid PDA, patent PLA stent, and evidence for distal collateralization to the circumflex vessel from the PLA vessel.  LVEDP 14 mmHg.  RECOMMENDATION: DAPT for a minimum of a year and probably indefinitely.  Medical therapy for concomitant CAD.  If patient is statin intolerance, consider Zetia and PCSK9 inhibition.       ASSESSMENT AND PLAN:   1. CAD s/p multiple PCI/DES: s/p STEMI in 04/2017 with PCI/DES to LAD with patent dRCA and PLA stents noted. He denies anginal complaints and has not needed his SL nitro. He reports cost of brilinta went up to $900 for a 3 month supply which is not sustainable - Continue aspirin  - Will transition from brilinta to plavix at this time for long term DAPT given extensive stent history - Continue amlodipine and metoprolol  2. HTN: BP up today but he reports it typically runs in the 120s/80. He has had some dietary indiscretions and ~5lb weight gain recently.  - Continue amlodpine and metoprolol at current doses for now - Encouraged low sodium diet and lifestyle modifications to promote weight loss and improved blood pressures - Could consider increasing amlodipine if BP persistently elevated  3. HLD: LDL 85 04/2017; goal <70; prior intolerance to  high-intensity statins. He reports muscle cramping with pravastatin, which he stopped 1 month ago and noted improvement in cramps.  - Will check FLP and CMET today - Will start zetia  10mg  daily - Will send a referral to the lipid clinic for PSK-9 inhibitor consideration  4. DM type 2: A1C 5.9 04/2017; at goal of <7 - Continue metformin    Current medicines are reviewed at length with the patient today.  The patient does not have concerns regarding medicines.  The following changes have been made:  no change  Labs/ tests ordered today include:   Orders Placed This Encounter  Procedures  . Comprehensive Metabolic Panel (CMET)  . Lipid panel  . EKG 12-Lead     Disposition:   FU with Dr. Stanford Breed in 6 months  Signed, Abigail Butts, PA-C  05/01/2018 12:30 PM

## 2018-05-01 NOTE — Patient Instructions (Addendum)
Medication Instructions:  START Zetia 10mg  take 1 tablet once a day START Plavix 75mg  take 1 tablet once a day STOP Pravastatin STOP Brilinta If you need a refill on your cardiac medications before your next appointment, please call your pharmacy.   Lab work: Your physician recommends that you return for lab work in: TODAY-CMET, LIPID If you have labs (blood work) drawn today and your tests are completely normal, you will receive your results only by: Marland Kitchen MyChart Message (if you have MyChart) OR . A paper copy in the mail If you have any lab test that is abnormal or we need to change your treatment, we will call you to review the results.  Testing/Procedures: NONE   Follow-Up: At [facility], you and your health needs are our priority.  As part of our continuing mission to provide you with exceptional heart care, we have created designated Provider Care Teams.  These Care Teams include your primary Cardiologist (physician) and Advanced Practice Providers (APPs -  Physician Assistants and Nurse Practitioners) who all work together to provide you with the care you need, when you need it. You will need a follow up appointment in 6 months.  Please call our office 2 months (MAY 2020) in advance to schedule this appointment.  You may see Kirk Ruths, MD or one of the following Advanced Practice Providers on your designated Care Team:   Kerin Ransom, PA-C Roby Lofts, [place] . Sande Rives, PA-C  Your physician recommends that you schedule a follow-up appointment in: [place]  Any Other Special Instructions Will Be Listed Below (If Applicable).

## 2018-05-04 ENCOUNTER — Telehealth: Payer: Self-pay | Admitting: *Deleted

## 2018-05-04 NOTE — Telephone Encounter (Signed)
Left message for patient to call and schedule LIPID consult with Pharm D

## 2018-05-05 NOTE — Telephone Encounter (Signed)
Left message for patient to call and schedule LIPID consult with the Pharm D

## 2018-05-07 ENCOUNTER — Telehealth: Payer: Self-pay | Admitting: Cardiology

## 2018-05-07 ENCOUNTER — Encounter: Payer: Self-pay | Admitting: *Deleted

## 2018-05-07 ENCOUNTER — Ambulatory Visit (INDEPENDENT_AMBULATORY_CARE_PROVIDER_SITE_OTHER): Payer: Medicare HMO | Admitting: Pharmacist

## 2018-05-07 VITALS — BP 132/84 | Ht 72.0 in | Wt 233.8 lb

## 2018-05-07 DIAGNOSIS — E785 Hyperlipidemia, unspecified: Secondary | ICD-10-CM | POA: Diagnosis not present

## 2018-05-07 NOTE — Telephone Encounter (Signed)
° °  Patient's spouse returning call, nurse unavailable. 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

## 2018-05-07 NOTE — Telephone Encounter (Signed)
Left message for pt to call.

## 2018-05-07 NOTE — Telephone Encounter (Signed)
New Message:   Patient would like for some one to call him. Please call patient wife.

## 2018-05-07 NOTE — Progress Notes (Signed)
Patient ID: [name].                 DOB: 1945-12-29                    MRN: [id number]     HPI: [name]. is a 73 y.o. male patient of [name] referred to lipid clinic by Andrew Lofts PA-C. PMH is significant for hyperlipidemia, CAD, STEMI on 04-14-2017, hypertension, TIA, and DM-II.  He reports inability to take statins daily due to muscle pain ,but still taking pravastatin "once in a while".  Current Medications:  Ezetimibe 10mg  daily - not yet started  Intolerances:  Pravastatin 80mg  daily - severe muscle aches Pravastatin 10mg  daily Fenofibrate 145mg  daily  LDL goal: 70mg /dL  Diet: no particular diet, eat everything he wants,"know it needs to improve"  Exercise: activities of daily living  Family History: The patient's family history includes Diabetes in his sister; Heart disease in his mother and sister.   Social History: The patient  reports that he has never smoked. He has never used smokeless tobacco. He reports that he does not drink alcohol or use drugs.  Labs:  05/01/2018: CHO 152; TG 209; HDL 30; LDL-c 80 (occasional pravastatin)  Past Medical History:  Diagnosis Date  . CAD (coronary artery disease)    a. s/p PCI to RCA 2002;  b. NSTEMI 10/11: cath with pLAD 30%, mLAD 30-40%, pCFX 20%, mCFX 60%, dCFX occluded (med Rx); pRCA 20%, dRCA 95% before stent tx with BMS, RCA stent ok, m-d PDA 90-95% (med Rx); pPL2 80% tx with DES; EF 55% c.  75% distal RCA ISR s/p DES. 70% mid LCx unchanged from prior studies. Patent prior RCA, PLA stents. Subtotal mid-dis PDA occlusion. EF  55-60%. d.) DES x2 placed to  . Hx of adenomatous and sessile serrated colonic polyps 03/14/2014  . Hx of echocardiogram    a. Echo 04/04/12: Mild LVH, EF 55-60%, normal wall motion, mildly calcified aortic valve annulus, mild RVE.  Marland Kitchen Hyperlipidemia    intolerant to statins => fenofibrate started 1/14 (Trigs > 400)  . Hypertension   . Myocardial infarction ([place])   . STEMI (ST  elevation myocardial infarction) ([place])    a.) DES x2 placed to mid and distal LAD, patent RCA stent. EF 60-65% (04/24/17)  . TIA (transient ischemic attack) 2002  . Type 2 diabetes mellitus ([place])    dx 04/2012 => metformin started    Current Outpatient Medications on File Prior to Visit  Medication Sig Dispense Refill  . amLODipine (NORVASC) 5 MG tablet TAKE 1 TABLET BY MOUTH DAILY 90 tablet 0  . aspirin 81 MG chewable tablet Chew 1 tablet (81 mg total) by mouth daily.    . metFORMIN (GLUCOPHAGE) 1000 MG tablet Take 1,000 mg by mouth 2 (two) times daily with a meal.    . metoprolol tartrate (LOPRESSOR) 25 MG tablet TAKE 1 TABLET BY MOUTH TWICE DAILY 60 tablet 10  . Multiple Vitamin (MULTIVITAMIN WITH MINERALS) TABS tablet Take 1 tablet by mouth daily.    . pravastatin (PRAVACHOL) 10 MG tablet Take 10 mg by mouth 3 (three) times a week.    . clopidogrel (PLAVIX) 75 MG tablet Take 1 tablet (75 mg total) by mouth daily. (Patient not taking: Reported on 05/07/2018) 30 tablet 6  . ezetimibe (ZETIA) 10 MG tablet Take 1 tablet (10 mg total) by mouth daily. (Patient not taking: Reported on 05/07/2018) 30 tablet 6  .  nitroGLYCERIN (NITROSTAT) 0.4 MG SL tablet Place 1 tablet (0.4 mg total) under the tongue every 5 (five) minutes as needed for chest pain. (Patient not taking: Reported on 05/07/2018) 25 tablet 3   No current facility-administered medications on file prior to visit.     Allergies  Allergen Reactions  . Statins Other (See Comments)    Myalgias with 3 different statins - Crestor, atorvastatin, pravastain    Dyslipidemia LDL remain above goal for secondary prevention. Patient still taking pravastatin "once in a while" ,but haven't picked up ezetimibe prescription yet.  We discussed reasons for low LDL and patient is willing to increase frequency of pravastatin administration.  He is NOT interested on Repatha at this time.  Will start ezetimibe 10mg  daily as prescribed, increase  pravastatin 10mg  to 3x/weekly, and repeat fasting lipid panel in 2-3 months.    Caresse Sedivy Rodriguez-Guzman PharmD, BCPS, [place] [address] [place] [address] 05/08/2018 4:53 PM

## 2018-05-07 NOTE — Telephone Encounter (Signed)
[place] for DOT; forward last echo and cath [employer]

## 2018-05-07 NOTE — Patient Instructions (Signed)
Lipid Clinic  (pharmacist) Deette Revak/Kristin  *START taking ezetimibe 10mg  daily* *CONTINUE taking pravastatin 1 - 2 times per week* *REPEAT fasting blood work in 3 months*   Cholesterol  Cholesterol is a fat. Your body needs a small amount of cholesterol. Cholesterol (plaque) may build up in your blood vessels (arteries). That makes you more likely to have a heart attack or stroke. You cannot feel your cholesterol level. Having a blood test is the only way to find out if your level is high. Keep your test results. Work with your doctor to keep your cholesterol at a good level. What do the results mean?  Total cholesterol is how much cholesterol is in your blood.  LDL is bad cholesterol. This is the type that can build up. Try to have low LDL.  HDL is good cholesterol. It cleans your blood vessels and carries LDL away. Try to have high HDL.  Triglycerides are fat that the body can store or burn for energy. What are good levels of cholesterol?  Total cholesterol below 200.  LDL below 100 is good for people who have health risks. LDL below 70 is good for people who have very high risks.  HDL above 40 is good. It is best to have HDL of 60 or higher.  Triglycerides below 150. How can I lower my cholesterol? Diet Follow your diet program as told by your doctor.  Choose fish, white meat chicken, or [place] that is roasted or baked. Try not to eat red meat, fried foods, sausage, or lunch meats.  Eat lots of fresh fruits and vegetables.  Choose whole grains, beans, pasta, potatoes, and cereals.  Choose olive oil, corn oil, or canola oil. Only use small amounts.  Try not to eat butter, mayonnaise, shortening, or palm kernel oils.  Try not to eat foods with trans fats.  Choose low-fat or nonfat dairy foods. ? Drink skim or nonfat milk. ? Eat low-fat or nonfat yogurt and cheeses. ? Try not to drink whole milk or cream. ? Try not to eat ice cream, egg yolks, or full-fat  cheeses.  Healthy desserts include angel food cake, ginger snaps, animal crackers, hard candy, popsicles, and low-fat or nonfat frozen yogurt. Try not to eat pastries, cakes, pies, and cookies.  Exercise Follow your exercise program as told by your doctor.  Be more active. Try gardening, walking, and taking the stairs.  Ask your doctor about ways that you can be more active. Medicine  Take over-the-counter and prescription medicines only as told by your doctor. This information is not intended to replace advice given to you by your health care provider. Make sure you discuss any questions you have with your health care provider. Document Released: 06/21/2008 Document Revised: 10/25/2015 Document Reviewed: 10/05/2015 Elsevier Interactive Patient Education  2019 [employer].

## 2018-05-07 NOTE — Telephone Encounter (Signed)
Spoke with pt wife, aware records faxed to [phone].

## 2018-05-07 NOTE — Telephone Encounter (Signed)
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 Will forward for dr Stanford Breed review for okay for letter.

## 2018-05-08 ENCOUNTER — Encounter: Payer: Self-pay | Admitting: Pharmacist

## 2018-05-08 NOTE — Assessment & Plan Note (Addendum)
LDL remain above goal for secondary prevention. Patient still taking pravastatin "once in a while" ,but haven't picked up ezetimibe prescription yet.  We discussed reasons for low LDL and patient is willing to increase frequency of pravastatin administration.  He is NOT interested on Repatha at this time.  Will start ezetimibe 10mg  daily as prescribed, increase pravastatin 10mg  to 3x/weekly, and repeat fasting lipid panel in 2-3 months.

## 2018-06-07 ENCOUNTER — Other Ambulatory Visit (HOSPITAL_COMMUNITY): Payer: Self-pay | Admitting: Cardiology

## 2018-11-27 ENCOUNTER — Other Ambulatory Visit: Payer: Self-pay | Admitting: Cardiology

## 2018-11-27 MED ORDER — CLOPIDOGREL BISULFATE 75 MG PO TABS: 75.0000 mg | ORAL_TABLET | Freq: Every day | ORAL | 5 refills | Status: DC

## 2018-11-27 NOTE — Telephone Encounter (Signed)
Pt's medication was sent to pt's pharmacy as requested. Confirmation received.  °

## 2018-11-27 NOTE — Telephone Encounter (Signed)
°  Patient has 2 pills remaining    1. Which medications need to be refilled? (please list name of each medication and dose if known) clopidogrel (PLAVIX) 75 MG tablet  2. Which pharmacy/location (including street and city if local pharmacy) is medication to be sent to? [place], [place] [address]  3. Do they need a 30 day or 90 day supply? [place]
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
## 2018-12-09 ENCOUNTER — Encounter: Payer: Self-pay | Admitting: Cardiology

## 2018-12-09 ENCOUNTER — Ambulatory Visit: Payer: Medicare HMO | Admitting: Cardiology

## 2018-12-09 ENCOUNTER — Other Ambulatory Visit: Payer: Self-pay

## 2018-12-09 VITALS — BP 138/70 | HR 63 | Temp 97.9°F | Ht 72.0 in | Wt 244.0 lb

## 2018-12-09 DIAGNOSIS — I2510 Atherosclerotic heart disease of native coronary artery without angina pectoris: Secondary | ICD-10-CM | POA: Diagnosis not present

## 2018-12-09 DIAGNOSIS — E785 Hyperlipidemia, unspecified: Secondary | ICD-10-CM | POA: Diagnosis not present

## 2018-12-09 DIAGNOSIS — I10 Essential (primary) hypertension: Secondary | ICD-10-CM

## 2018-12-09 LAB — HEPATIC FUNCTION PANEL
ALT: 14 IU/L (ref 0–44)
AST: 15 IU/L (ref 0–40)
Albumin: 4.5 g/dL (ref 3.7–4.7)
Alkaline Phosphatase: 71 IU/L (ref 39–117)
Bilirubin Total: 0.3 mg/dL (ref 0.0–1.2)
Bilirubin, Direct: 0.11 mg/dL (ref 0.00–0.40)
Total Protein: 7.1 g/dL (ref 6.0–8.5)

## 2018-12-09 LAB — LIPID PANEL
Chol/HDL Ratio: 5.7 ratio — ABNORMAL HIGH (ref 0.0–5.0)
Cholesterol, Total: 137 mg/dL (ref 100–199)
HDL: 24 mg/dL — ABNORMAL LOW (ref >39)
LDL Chol Calc (NIH): 50 mg/dL (ref 0–99)
Triglycerides: 421 mg/dL — ABNORMAL HIGH (ref 0–149)
VLDL Cholesterol Cal: 63 mg/dL — ABNORMAL HIGH (ref 5–40)

## 2018-12-09 MED ORDER — AMLODIPINE BESYLATE 10 MG PO TABS: 10.0000 mg | ORAL_TABLET | Freq: Every day | ORAL | 3 refills | Status: DC

## 2018-12-09 NOTE — Patient Instructions (Signed)
Medication Instructions:  INCREASE AMLODIPINE TO 10 MG ONCE DAILY= 2 OF THE 5 MG TABLETS ONCE DAILY  STOP PLAVIX  If you need a refill on your cardiac medications before your next appointment, please call your pharmacy.   Lab work: Your physician recommends that you HAVE LAB WORK TODAY If you have labs (blood work) drawn today and your tests are completely normal, you will receive your results only by: Marland Kitchen MyChart Message (if you have MyChart) OR . A paper copy in the mail If you have any lab test that is abnormal or we need to change your treatment, we will call you to review the results.  Follow-Up: At [facility], you and your health needs are our priority.  As part of our continuing mission to provide you with exceptional heart care, we have created designated Provider Care Teams.  These Care Teams include your primary Cardiologist (physician) and Advanced Practice Providers (APPs -  Physician Assistants and Nurse Practitioners) who all work together to provide you with the care you need, when you need it. You will need a follow up appointment in 12 months.  Please call our office 2 months in advance to schedule this appointment.  You may see Kirk Ruths, MD or one of the following Advanced Practice Providers on your designated Care Team:   Kerin Ransom, PA-C Roby Lofts, [place] . Sande Rives, PA-C

## 2018-12-10 ENCOUNTER — Encounter: Payer: Self-pay | Admitting: *Deleted

## 2018-12-10 NOTE — Telephone Encounter (Addendum)
Left message for pt to call   ----- Message from Lelon Perla, MD sent at 12/09/2018  5:06 PM EDT ----- FU lipid clinic Kirk Ruths

## 2018-12-29 ENCOUNTER — Encounter: Payer: Self-pay | Admitting: *Deleted

## 2018-12-29 NOTE — Telephone Encounter (Signed)
This encounter was created in error - please disregard.

## 2019-11-03 ENCOUNTER — Other Ambulatory Visit: Payer: Self-pay | Admitting: Medical

## 2019-11-03 NOTE — Telephone Encounter (Signed)
This is Dr. Crenshaw's pt 
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
## 2020-01-14 ENCOUNTER — Other Ambulatory Visit: Payer: Self-pay | Admitting: Cardiology

## 2020-01-20 ENCOUNTER — Other Ambulatory Visit: Payer: Self-pay

## 2020-01-20 ENCOUNTER — Ambulatory Visit: Payer: Medicare HMO | Admitting: Cardiology

## 2020-01-20 ENCOUNTER — Encounter: Payer: Self-pay | Admitting: Cardiology

## 2020-01-20 VITALS — BP 140/80 | HR 57 | Ht 72.0 in | Wt 245.0 lb

## 2020-01-20 DIAGNOSIS — I10 Essential (primary) hypertension: Secondary | ICD-10-CM | POA: Diagnosis not present

## 2020-01-20 DIAGNOSIS — I2510 Atherosclerotic heart disease of native coronary artery without angina pectoris: Secondary | ICD-10-CM

## 2020-01-20 DIAGNOSIS — E785 Hyperlipidemia, unspecified: Secondary | ICD-10-CM | POA: Diagnosis not present

## 2020-01-20 LAB — HEPATIC FUNCTION PANEL
ALT: 22 IU/L (ref 0–44)
AST: 22 IU/L (ref 0–40)
Albumin: 4.4 g/dL (ref 3.7–4.7)
Alkaline Phosphatase: 75 IU/L (ref 44–121)
Bilirubin Total: 0.8 mg/dL (ref 0.0–1.2)
Bilirubin, Direct: 0.21 mg/dL (ref 0.00–0.40)
Total Protein: 6.7 g/dL (ref 6.0–8.5)

## 2020-01-20 LAB — LIPID PANEL
Chol/HDL Ratio: 4.6 ratio (ref 0.0–5.0)
Cholesterol, Total: 129 mg/dL (ref 100–199)
HDL: 28 mg/dL — ABNORMAL LOW (ref >39)
LDL Chol Calc (NIH): 70 mg/dL (ref 0–99)
Triglycerides: 183 mg/dL — ABNORMAL HIGH (ref 0–149)
VLDL Cholesterol Cal: 31 mg/dL (ref 5–40)

## 2020-01-20 NOTE — Patient Instructions (Signed)
  Lab Work:  Your physician recommends that you HAVE LAB WORK TODAY  If you have labs (blood work) drawn today and your tests are completely normal, you will receive your results only by: MyChart Message (if you have MyChart) OR A paper copy in the mail If you have any lab test that is abnormal or we need to change your treatment, we will call you to review the results.   Follow-Up: At [facility], you and your health needs are our priority.  As part of our continuing mission to provide you with exceptional heart care, we have created designated Provider Care Teams.  These Care Teams include your primary Cardiologist (physician) and Advanced Practice Providers (APPs -  Physician Assistants and Nurse Practitioners) who all work together to provide you with the care you need, when you need it.  We recommend signing up for the patient portal called "MyChart".  Sign up information is provided on this After Visit Summary.  MyChart is used to connect with patients for Virtual Visits (Telemedicine).  Patients are able to view lab/test results, encounter notes, upcoming appointments, etc.  Non-urgent messages can be sent to your provider as well.   To learn more about what you can do with MyChart, go to [email or web address].    Your next appointment:   12 month(s)  The format for your next appointment:   In Person  Provider:   Brian Crenshaw, MD   

## 2020-01-21 ENCOUNTER — Encounter: Payer: Self-pay | Admitting: *Deleted

## 2020-04-12 ENCOUNTER — Other Ambulatory Visit: Payer: Self-pay | Admitting: Cardiology

## 2020-04-13 ENCOUNTER — Telehealth: Payer: Self-pay | Admitting: Cardiology

## 2020-04-13 NOTE — Telephone Encounter (Signed)
*  STAT* If patient is at the pharmacy, call can be transferred to refill team.   1. Which medications need to be refilled? (please list name of each medication and dose if known) need a new prescription for Amlodipine 10 mg  2. Which pharmacy/location (including street and city if local pharmacy) is medication to be sent to? [facility]  [place]  3. Do they need a 30 day or 90 day supply? 90 days and refills

## 2021-05-30 ENCOUNTER — Other Ambulatory Visit: Payer: Self-pay | Admitting: Cardiology

## 2021-06-28 ENCOUNTER — Other Ambulatory Visit: Payer: Self-pay | Admitting: Cardiology

## 2021-07-23 ENCOUNTER — Ambulatory Visit: Payer: Medicare HMO

## 2021-07-23 ENCOUNTER — Ambulatory Visit: Payer: Medicare HMO | Admitting: Physician Assistant

## 2021-07-23 ENCOUNTER — Encounter: Payer: Self-pay | Admitting: Physician Assistant

## 2021-07-23 VITALS — BP 140/58 | HR 67 | Ht 72.0 in | Wt 250.0 lb

## 2021-07-23 DIAGNOSIS — R002 Palpitations: Secondary | ICD-10-CM

## 2021-07-23 DIAGNOSIS — E785 Hyperlipidemia, unspecified: Secondary | ICD-10-CM

## 2021-07-23 DIAGNOSIS — I10 Essential (primary) hypertension: Secondary | ICD-10-CM | POA: Diagnosis not present

## 2021-07-23 DIAGNOSIS — G459 Transient cerebral ischemic attack, unspecified: Secondary | ICD-10-CM

## 2021-07-23 DIAGNOSIS — E119 Type 2 diabetes mellitus without complications: Secondary | ICD-10-CM

## 2021-07-23 DIAGNOSIS — I2510 Atherosclerotic heart disease of native coronary artery without angina pectoris: Secondary | ICD-10-CM

## 2021-07-23 MED ORDER — METOPROLOL TARTRATE 25 MG PO TABS: 37.5000 mg | ORAL_TABLET | Freq: Two times a day (BID) | ORAL | 3 refills | Status: DC

## 2021-07-23 NOTE — Patient Instructions (Addendum)
Medication Instructions:  ?INCREASE Metoprolol Tartrate (Lopressor) to 37.5 mg (1.5 tablets) 2 times a day  ? ?*If you need a refill on your cardiac medications before your next appointment, please call your pharmacy* ? ?Lab Work: ?NONE ordered at this time of appointment  ? ?If you have labs (blood work) drawn today and your tests are completely normal, you will receive your results only by: ?MyChart Message (if you have MyChart) OR ?A paper copy in the mail ?If you have any lab test that is abnormal or we need to change your treatment, we will call you to review the results. ? ?Testing/Procedures: ? ?ZIO XT- Long Term Monitor Instructions ? ?Your physician has requested you wear a ZIO patch monitor for 3 days.  ?This is a single patch monitor. [employer] supplies one patch monitor per enrollment. Additional ?stickers are not available. Please do not apply patch if you will be having a Nuclear Stress Test,  ?Echocardiogram, Cardiac CT, MRI, or Chest Xray during the period you would be wearing the  ?monitor. The patch cannot be worn during these tests. You cannot remove and re-apply the  ?ZIO XT patch monitor.  ?Your ZIO patch monitor will be mailed 3 day USPS to your address on file. It may take 3-5 days  ?to receive your monitor after you have been enrolled.  ?Once you have received your monitor, please review the enclosed instructions. Your monitor  ?has already been registered assigning a specific monitor serial # to you. ? ?Billing and Patient Assistance Program Information ? ?We have supplied [employer] with any of your insurance information on file for billing purposes. ?[employer] offers a sliding scale Patient Assistance Program for patients that do not have  ?insurance, or whose insurance does not completely cover the cost of the ZIO monitor.  ?You must apply for the Patient Assistance Program to qualify for this discounted rate.  ?To apply, please call [employer] at [phone], select option 4, select option 2, ask  to apply for  ?Patient Assistance Program. Theodore Demark will ask your household income, and how many people  ?are in your household. They will quote your out-of-pocket cost based on that information.  ?[employer] will also be able to set up a 57-month interest-free payment plan if needed. ? ?Applying the monitor ?  ?Shave hair from upper left chest.  ?Hold abrader disc by orange tab. Rub abrader in 40 strokes over the upper left chest as  ?indicated in your monitor instructions.  ?Clean area with 4 enclosed alcohol pads. Let dry.  ?Apply patch as indicated in monitor instructions. Patch will be placed under collarbone on left  ?side of chest with arrow pointing upward.  ?Rub patch adhesive wings for 2 minutes. Remove white label marked "1". Remove the white  ?label marked "2". Rub patch adhesive wings for 2 additional minutes.  ?While looking in a mirror, press and release button in center of patch. A small green light will  ?flash 3-4 times. This will be your only indicator that the monitor has been turned on.  ?Do not shower for the first 24 hours. You may shower after the first 24 hours.  ?Press the button if you feel a symptom. You will hear a small click. Record Date, Time and  ?Symptom in the Patient Logbook.  ?When you are ready to remove the patch, follow instructions on the last 2 pages of Patient  ?Logbook. Stick patch monitor onto the last page of Patient Logbook.  ?Place Patient Logbook in the blue  and white box. Use locking tab on box and tape box closed  ?securely. The blue and white box has prepaid postage on it. Please place it in the mailbox as  ?soon as possible. Your physician should have your test results approximately 7 days after the  ?monitor has been mailed back to [facility].  ?Call [facility] at [phone] if you have questions regarding  ?your ZIO XT patch monitor. Call them immediately if you see an orange light blinking on your  ?monitor.  ?If your monitor falls off in  less than 4 days, contact our Monitor department at [phone].  ?If your monitor becomes loose or falls off after 4 days call [employer] at [phone] for  ?suggestions on securing your monitor ? ?Follow-Up: ?At [facility], you and your health needs are our priority.  As part of our continuing mission to provide you with exceptional heart care, we have created designated Provider Care Teams.  These Care Teams include your primary Cardiologist (physician) and Advanced Practice Providers (APPs -  Physician Assistants and Nurse Practitioners) who all work together to provide you with the care you need, when you need it. ? ?We recommend signing up for the patient portal called "MyChart".  Sign up information is provided on this After Visit Summary.  MyChart is used to connect with patients for Virtual Visits (Telemedicine).  Patients are able to view lab/test results, encounter notes, upcoming appointments, etc.  Non-urgent messages can be sent to your provider as well.   ?To learn more about what you can do with MyChart, go to [email or web address].   ? ?Your next appointment:   ?6 month(s) ? ?The format for your next appointment:   ?In Person ? ?Provider:   ?Kirk Ruths, MD   ? ? ?Other Instructions ? ? ?Important Information About Sugar ? ? ? ? ? ? ?

## 2021-07-23 NOTE — Progress Notes (Signed)
?Cardiology Office Note:   ? ?Date:  07/25/2021  ? ?ID:  Andrew Asal., DOB 1946/02/09, MRN [id number] ? ?PCP:  Enid Skeens., MD ?  ?[facility] Providers ?Cardiologist:  Kirk Ruths, MD    ? ?Referring MD: Enid Skeens., MD  ? ?Chief Complaint  ?Patient presents with  ? Follow-up  ?  Seen for Dr. Stanford Breed  ? Palpitations  ? ? ?History of Present Illness:   ? ?Andrew Record Therman Hughlett. is a 76 y.o. male with a hx of CAD, HTN, HLD, DM II and TIA.  Patient had a PCI of RCA in 2002.  He had NSTEMI in October 2011 treated with bare-metal stent to the distal RCA and drug-eluting stent to PL2.  Abdominal ultrasound in December 2013 showed no evidence of AAA.  He is intolerant of statins.  Repeat cardiac catheterization in January 2019 in the setting of acute anterior MI showed 99% proximal to mid LAD, 95% mid to distal LAD occluded OM 3, patent stent in the distal RCA and posterolateral.  He underwent PCI of LAD.  Echocardiogram in January 2019 showed normal EF, mild diastolic dysfunction, mild LAE and mild RV enlargement.  Patient was last seen by Dr. Stanford Breed in October 2021 at which time he was doing well.  He was on Zetia during the last visit, LDL was 70. ? ?Patient presents today for 28-monthfollow-up.  He denies any obvious chest discomfort.  He has chronic dyspnea on exertion.  He has been having tightness in his forehead and palpitation night.  On EKG, he was in sinus rhythm with ventricular bigeminy.  I recommended increasing metoprolol to 37.5 mg twice a day.  Blood pressure borderline elevated.  He had blood work done at PCPs office 2 weeks ago.  We will request those blood work.  I also recommended a 3-day ZIO monitor, if PVC burden is greater than 10%, I would recommend a echocardiogram.  Otherwise, he can follow-up in 6 months. ? ?Past Medical History:  ?Diagnosis Date  ? CAD (coronary artery disease)   ? a. s/p PCI to RCA 2002;  b. NSTEMI 10/11: cath with pLAD 30%, mLAD 30-40%, pCFX 20%, mCFX  60%, dCFX occluded (med Rx); pRCA 20%, dRCA 95% before stent tx with BMS, RCA stent ok, m-d PDA 90-95% (med Rx); pPL2 80% tx with DES; EF 55% c.  75% distal RCA ISR s/p DES. 70% mid LCx unchanged from prior studies. Patent prior RCA, PLA stents. Subtotal mid-dis PDA occlusion. EF  55-60%. d.) DES x2 placed to  ? Hx of adenomatous and sessile serrated colonic polyps 03/14/2014  ? Hx of echocardiogram   ? a. Echo 04/04/12: Mild LVH, EF 55-60%, normal wall motion, mildly calcified aortic valve annulus, mild RVE.  ? Hyperlipidemia   ? intolerant to statins => fenofibrate started 1/14 (Trigs > 400)  ? Hypertension   ? Myocardial infarction ([facility]   ? STEMI (ST elevation myocardial infarction) ([place]   ? a.) DES x2 placed to mid and distal LAD, patent RCA stent. EF 60-65% (04/24/17)  ? TIA (transient ischemic attack) 2002  ? Type 2 diabetes mellitus ([facility]   ? dx 04/2012 => metformin started  ? ? ?Past Surgical History:  ?Procedure Laterality Date  ? COLONOSCOPY    ? CORONARY ANGIOPLASTY  03/2012  ? 75% distal RCA ISR s/p DES. 70% mid LCx unchanged from prior studies. Patent prior RCA, PLA stents. Subtotal mid-dis PDA occlusion. Diffuse nonobstructive disease elsewhere. LVEF 55-65%  ?  CORONARY ANGIOPLASTY WITH STENT PLACEMENT  /4 stents  ? Dist RCA 4.0 x 15 mm BMS, PL 2.5 x 12 mm ION DES  ? CORONARY/GRAFT ACUTE MI REVASCULARIZATION N/A 04/24/2017  ? Procedure: Coronary/Graft Acute MI Revascularization;  Surgeon: Troy Sine, MD;  Location: [facility];  Service: Cardiovascular;  Laterality: N/A;  ? HERNIA REPAIR  2007  ? [facility]  ? HERNIA REPAIR  02/04/11  ? BIH repair   ? LEFT HEART CATH AND CORONARY ANGIOGRAPHY N/A 04/24/2017  ? Procedure: LEFT HEART CATH AND CORONARY ANGIOGRAPHY;  Surgeon: Troy Sine, MD;  Location: [facility];  Service: Cardiovascular;  Laterality: N/A;  ? LEFT HEART CATHETERIZATION WITH CORONARY ANGIOGRAM Bilateral 04/06/2012  ? Procedure: LEFT HEART CATHETERIZATION WITH CORONARY  ANGIOGRAM;  Surgeon: Sherren Mocha, MD;  Location: [facility] CATH LAB;  Service: Cardiovascular;  Laterality: Bilateral;  ? PERCUTANEOUS STENT INTERVENTION Right 04/06/2012  ? Procedure: PERCUTANEOUS STENT INTERVENTION;  Surgeon: Sherren Mocha, MD;  Location: [facility] CATH LAB;  Service: Cardiovascular;  Laterality: Right;  des x1 distal rca  ? ? ?Current Medications: ?Current Meds  ?Medication Sig  ? amLODipine (NORVASC) 10 MG tablet Take 1 tablet (10 mg total) by mouth daily. PATIENT MUST KEEP UPCOMING APPT. FOR FUTURE REFILLS.  ? aspirin 81 MG chewable tablet Chew 1 tablet (81 mg total) by mouth daily.  ? ezetimibe (ZETIA) 10 MG tablet Take 1 tablet (10 mg total) by mouth daily.  ? fenofibrate 160 MG tablet Take 160 mg by mouth daily.  ? metFORMIN (GLUCOPHAGE) 1000 MG tablet Take 1,000 mg by mouth 2 (two) times daily with a meal.  ? Multiple Vitamin (MULTIVITAMIN WITH MINERALS) TABS tablet Take 1 tablet by mouth daily.  ? nitroGLYCERIN (NITROSTAT) 0.4 MG SL tablet DISSOLVE ONE TABLET UNDER THE TONGUE EVERY 5 MINUTES AS NEEDED FOR CHEST PAIN.  ? Omega-3 Fatty Acids (FISH OIL OMEGA-3 PO) Take by mouth.  ? [DISCONTINUED] metoprolol tartrate (LOPRESSOR) 25 MG tablet TAKE 1 TABLET BY MOUTH TWICE DAILY  ?  ? ?Allergies:   Statins  ? ?Social History  ? ?Socioeconomic History  ? Marital status: Married  ?  Spouse name: Not on file  ? Number of children: Not on file  ? Years of education: Not on file  ? Highest education level: Not on file  ?Occupational History  ? Occupation: Works on Tax adviser  ?Tobacco Use  ? Smoking status: Never  ? Smokeless tobacco: Never  ?Substance and Sexual Activity  ? Alcohol use: No  ? Drug use: No  ? Sexual activity: Not on file  ?Other Topics Concern  ? Not on file  ?Social History Narrative  ? Lives with wife, is active around the house and yard. Still works. Admits he does not eat healthy food.  ? ?Social Determinants of Health  ? ?Financial Resource Strain: Not on file  ?Food Insecurity: Not on  file  ?Transportation Needs: Not on file  ?Physical Activity: Not on file  ?Stress: Not on file  ?Social Connections: Not on file  ?  ? ?Family History: ?The patient's family history includes Diabetes in his sister; Heart disease in his mother and sister. ? ?ROS:   ?Please see the history of present illness.    ? All other systems reviewed and are negative. ? ?EKGs/Labs/Other Studies Reviewed:   ? ?The following studies were reviewed today: ? ?Echo 04/25/2017 ?LV EF: 60% -   65%  ? ?-------------------------------------------------------------------  ?Indications:      CAD  of native vessels 414.01.  ? ?-------------------------------------------------------------------  ?History:   PMH:  Anterior wall STEMI. Hypokalemia.  ? ?-------------------------------------------------------------------  ?Study Conclusions  ? ?- Left ventricle: The cavity size was normal. There was moderate  ?  focal basal hypertrophy. Systolic function was normal. The  ?  estimated ejection fraction was in the range of 60% to 65%. Wall  ?  motion was normal; there were no regional wall motion  ?  abnormalities. There was an increased relative contribution of  ?  atrial contraction to ventricular filling. Doppler parameters are  ?  consistent with abnormal left ventricular relaxation (grade 1  ?  diastolic dysfunction).  ?- Left atrium: The atrium was mildly dilated.  ?- Right ventricle: The cavity size was mildly dilated. Wall  ?  thickness was normal.  ? ?Impressions:  ? ?- normal LVF EF 60-65%, moderate BSH, mild TR, mild RVE, grade 1  ?  DD.  ? ?EKG:  EKG is ordered today.  The ekg ordered today demonstrates sinus rhythm with bigeminy ? ?Recent Labs: ?No results found for requested labs within last 8760 hours.  ?Recent Lipid Panel ?   ?Component Value Date/Time  ? CHOL 129 01/20/2020 1005  ? TRIG 183 (H) 01/20/2020 1005  ? HDL 28 (L) 01/20/2020 1005  ? CHOLHDL 4.6 01/20/2020 1005  ? CHOLHDL 6.7 04/25/2017 0334  ? VLDL 57 (H) 04/25/2017 0334   ? [place] 70 01/20/2020 1005  ? LDLDIRECT 83.8 01/22/2013 0838  ? ? ? ?Risk Assessment/Calculations:   ?  ? ?    ? ?Physical Exam:   ? ?VS:  BP (!) 140/58   Pulse 67   Ht 6' (1.829 m)   Wt 250 lb (113.4 kg)

## 2021-07-23 NOTE — Progress Notes (Unsigned)
Enrolled for [employer] to mail a ZIO XT long term holter monitor to the patients address on file.   Dr. Crenshaw to read. 

## 2021-07-25 ENCOUNTER — Encounter: Payer: Self-pay | Admitting: Physician Assistant

## 2021-08-06 ENCOUNTER — Encounter: Payer: Self-pay | Admitting: *Deleted

## 2021-08-06 NOTE — Telephone Encounter (Signed)
This encounter was created in error - please disregard.

## 2021-08-13 ENCOUNTER — Encounter: Payer: Self-pay | Admitting: *Deleted

## 2021-08-13 NOTE — Telephone Encounter (Signed)
This encounter was created in error - please disregard.

## 2021-10-02 ENCOUNTER — Telehealth: Payer: Self-pay | Admitting: Cardiology

## 2021-10-02 ENCOUNTER — Other Ambulatory Visit: Payer: Self-pay

## 2021-10-02 ENCOUNTER — Other Ambulatory Visit: Payer: Self-pay | Admitting: Cardiology

## 2021-10-02 MED ORDER — METOPROLOL TARTRATE 25 MG PO TABS: 25.0000 mg | ORAL_TABLET | Freq: Two times a day (BID) | ORAL | 3 refills | Status: DC

## 2021-12-30 ENCOUNTER — Other Ambulatory Visit: Payer: Self-pay | Admitting: Cardiology

## 2021-12-31 ENCOUNTER — Telehealth: Payer: Medicare HMO | Admitting: Cardiology

## 2021-12-31 MED ORDER — AMLODIPINE BESYLATE 10 MG PO TABS: 10.0000 mg | ORAL_TABLET | Freq: Every day | ORAL | 1 refills | Status: DC

## 2021-12-31 NOTE — Telephone Encounter (Signed)
*  STAT* If patient is at the pharmacy, call can be transferred to refill team.   1. Which medications need to be refilled? (please list name of each medication and dose if known)   amLODipine (NORVASC) 10 MG tablet  2. Which pharmacy/location (including street and city if local pharmacy) is medication to be sent to?  [place], [place] [address]  3. Do they need a 30 day or 90 day supply?   90 day  Patient stated he has 2-3 tablets left.

## 2022-04-26 ENCOUNTER — Telehealth: Payer: Self-pay | Admitting: Cardiology

## 2022-04-26 NOTE — Telephone Encounter (Signed)
Spoke with pt wife, Aware of dr crenshaw's recommendations.  

## 2022-04-26 NOTE — Telephone Encounter (Signed)
Wife was returning call. Please advise ?

## 2022-04-26 NOTE — Telephone Encounter (Signed)
FYI: Wife stated patient was at Dr. Lavonia Dana office yesterday and was told patient's pulse was in the 66s (patient had no symptoms). I called Dr. Lavonia Dana office for VS on 1/18. BP 133/79, P 81, R 18, sat 97%.

## 2022-04-26 NOTE — Telephone Encounter (Signed)
STAT if HR is under 50 or over 120 (normal HR is 60-100 beats per minute)  What is your heart rate?   Yesterday: 40 at his surgeon office    Do you have a log of your heart rate readings (document readings)? No   Do you have any other symptoms? No   Pt calling about metoprolol changes last year (see previous phone notes). It was changed by Eulas Post, PA then changed again by Dr. Stanford Breed in June. She states his hr was low yesterday at the doctor's office and she is concerned and wants to clarify how pt is supposed to be taking it . Please advise.

## 2022-04-26 NOTE — Telephone Encounter (Signed)
LMTCB.

## 2022-05-13 ENCOUNTER — Other Ambulatory Visit: Payer: Self-pay | Admitting: Cardiology

## 2023-01-03 ENCOUNTER — Other Ambulatory Visit: Payer: Self-pay | Admitting: Cardiology

## 2023-01-22 NOTE — Progress Notes (Signed)
HPI: FU CAD; s/p PCI to the RCA in 2002, s/p NSTEMI 10/11 treated with a BMS to the distal RCA and DES to the PL2. Abdominal U/S 12/13: No AAA.  Patient has been intolerant to statins.  Cardiac catheterization January 2019 in the setting of acute anterior myocardial infarction showed occluded LAD, occluded circumflex and patent stents in the distal right coronary artery, posterior lateral.  Patient had PCI of the LAD.  Echocardiogram January 2019 showed normal LV function, mild diastolic dysfunction, mild left atrial enlargement and mild right ventricular enlargement.  Since last seen, there is no dyspnea, chest pain, palpitations or syncope.  Current Outpatient Medications  Medication Sig Dispense Refill   amLODipine (NORVASC) 10 MG tablet Take 1 tablet (10 mg total) by mouth daily. 90 tablet 1   aspirin 81 MG chewable tablet Chew 1 tablet (81 mg total) by mouth daily.     fenofibrate 160 MG tablet Take 160 mg by mouth daily.     metFORMIN (GLUCOPHAGE) 1000 MG tablet Take 1,000 mg by mouth 2 (two) times daily with a meal.     metoprolol tartrate (LOPRESSOR) 25 MG tablet Take 1 tablet by mouth twice daily 60 tablet 0   Multiple Vitamin (MULTIVITAMIN WITH MINERALS) TABS tablet Take 1 tablet by mouth daily.     nitroGLYCERIN (NITROSTAT) 0.4 MG SL tablet DISSOLVE ONE TABLET UNDER THE TOUNGUE EVERY 5 MINUTES AS NEEDED FOR CHEST PAIN 25 tablet 0   Omega-3 Fatty Acids (FISH OIL OMEGA-3 PO) Take by mouth.     ezetimibe (ZETIA) 10 MG tablet Take 1 tablet (10 mg total) by mouth daily. 30 tablet 6   No current facility-administered medications for this visit.     Past Medical History:  Diagnosis Date   CAD (coronary artery disease)    a. s/p PCI to RCA 2002;  b. NSTEMI 10/11: cath with pLAD 30%, mLAD 30-40%, pCFX 20%, mCFX 60%, dCFX occluded (med Rx); pRCA 20%, dRCA 95% before stent tx with BMS, RCA stent ok, m-d PDA 90-95% (med Rx); pPL2 80% tx with DES; EF 55% c.  75% distal RCA ISR s/p DES. 70%  mid LCx unchanged from prior studies. Patent prior RCA, PLA stents. Subtotal mid-dis PDA occlusion. EF  55-60%. d.) DES x2 placed to   Hx of adenomatous and sessile serrated colonic polyps 03/14/2014   Hx of echocardiogram    a. Echo 04/04/12: Mild LVH, EF 55-60%, normal wall motion, mildly calcified aortic valve annulus, mild RVE.   Hyperlipidemia    intolerant to statins => fenofibrate started 1/14 (Trigs > 400)   Hypertension    Myocardial infarction [facility])    STEMI (ST elevation myocardial infarction) ([facility])    a.) DES x2 placed to mid and distal LAD, patent RCA stent. EF 60-65% (04/24/17)   TIA (transient ischemic attack) 2002   Type 2 diabetes mellitus ([facility])    dx 04/2012 => metformin started    Past Surgical History:  Procedure Laterality Date   COLONOSCOPY     CORONARY ANGIOPLASTY  03/2012   75% distal RCA ISR s/p DES. 70% mid LCx unchanged from prior studies. Patent prior RCA, PLA stents. Subtotal mid-dis PDA occlusion. Diffuse nonobstructive disease elsewhere. LVEF 55-65%   CORONARY ANGIOPLASTY WITH STENT PLACEMENT  /4 stents   Dist RCA 4.0 x 15 mm BMS, PL 2.5 x 12 mm ION DES   CORONARY/GRAFT ACUTE MI REVASCULARIZATION N/A 04/24/2017   Procedure: Coronary/Graft Acute MI Revascularization;  Surgeon: Lennette Bihari,  MD;  Location: [facility];  Service: Cardiovascular;  Laterality: N/A;   HERNIA REPAIR  2007   [facility]   HERNIA REPAIR  02/04/11   BIH repair    LEFT HEART CATH AND CORONARY ANGIOGRAPHY N/A 04/24/2017   Procedure: LEFT HEART CATH AND CORONARY ANGIOGRAPHY;  Surgeon: Lennette Bihari, MD;  Location: [facility];  Service: Cardiovascular;  Laterality: N/A;   LEFT HEART CATHETERIZATION WITH CORONARY ANGIOGRAM Bilateral 04/06/2012   Procedure: LEFT HEART CATHETERIZATION WITH CORONARY ANGIOGRAM;  Surgeon: Tonny Bollman, MD;  Location: [facility] CATH LAB;  Service: Cardiovascular;  Laterality: Bilateral;   PERCUTANEOUS STENT INTERVENTION Right 04/06/2012   Procedure:  PERCUTANEOUS STENT INTERVENTION;  Surgeon: Tonny Bollman, MD;  Location: [facility] CATH LAB;  Service: Cardiovascular;  Laterality: Right;  des x1 distal rca    Social History   Socioeconomic History   Marital status: Married    Spouse name: Not on file   Number of children: Not on file   Years of education: Not on file   Highest education level: Not on file  Occupational History   Occupation: Works on heavy Banker  Tobacco Use   Smoking status: Never   Smokeless tobacco: Never  Substance and Sexual Activity   Alcohol use: No   Drug use: No   Sexual activity: Not on file  Other Topics Concern   Not on file  Social History Narrative   Lives with wife, is active around the house and yard. Still works. Admits he does not eat healthy food.   Social Determinants of Health   Financial Resource Strain: Not on file  Food Insecurity: Low Risk  (09/12/2022)   Received from [facility]   Hunger Vital Sign    Worried About Running Out of Food in the Last Year: Never true    Ran Out of Food in the Last Year: Never true  Transportation Needs: Not on file (09/12/2022)  Physical Activity: Not on file  Stress: Not on file  Social Connections: Not on file  Intimate Partner Violence: Not on file    Family History  Problem Relation Age of Onset   Heart disease Mother    Diabetes Sister    Heart disease Sister     ROS: no fevers or chills, productive cough, hemoptysis, dysphasia, odynophagia, melena, hematochezia, dysuria, hematuria, rash, seizure activity, orthopnea, PND, pedal edema, claudication. Remaining systems are negative.  Physical Exam: Well-developed well-nourished in no acute distress.  Skin is warm and dry.  HEENT is normal.  Neck is supple.  Chest is clear to auscultation with normal expansion.  Cardiovascular exam is regular rate and rhythm.  Abdominal exam nontender or distended. No masses palpated. Extremities show no edema. neuro grossly intact  EKG  Interpretation Date/Time:  Tuesday February 04 2023 11:38:19 EDT Ventricular Rate:  69 PR Interval:  292 QRS Duration:  80 QT Interval:  394 QTC Calculation: 422 R Axis:   9  Text Interpretation: Sinus rhythm with 1st degree A-V block Cannot rule out Inferior infarct Confirmed by Olga Millers ([id number]) on 02/04/2023 11:43:37 AM    A/P  1 coronary artery disease-patient denies recurrent chest pain.  Continue aspirin.  He is intolerant to statins.  Continue risk factor modification.  2 hyperlipidemia-patient is intolerant to statins.  Continue Zetia.  Check lipids and liver.  If LDL not at goal we will add PCSK9 inhibitor.  3 hypertension-blood pressure elevated; add Avapro 160 mg daily.  Check potassium and renal function in 1  week.  Follow blood pressure and adjust regimen as needed.  Olga Millers, MD

## 2023-02-03 ENCOUNTER — Other Ambulatory Visit: Payer: Self-pay | Admitting: Cardiology

## 2023-02-04 ENCOUNTER — Encounter: Payer: Self-pay | Admitting: Cardiology

## 2023-02-04 ENCOUNTER — Ambulatory Visit: Payer: Medicare HMO | Attending: Cardiology | Admitting: Cardiology

## 2023-02-04 VITALS — BP 150/72 | HR 69 | Ht 72.0 in | Wt 255.0 lb

## 2023-02-04 DIAGNOSIS — I2510 Atherosclerotic heart disease of native coronary artery without angina pectoris: Secondary | ICD-10-CM | POA: Diagnosis not present

## 2023-02-04 DIAGNOSIS — E785 Hyperlipidemia, unspecified: Secondary | ICD-10-CM | POA: Diagnosis not present

## 2023-02-04 DIAGNOSIS — I10 Essential (primary) hypertension: Secondary | ICD-10-CM

## 2023-02-04 MED ORDER — AMLODIPINE BESYLATE 10 MG PO TABS: 10.0000 mg | ORAL_TABLET | Freq: Every day | ORAL | 3 refills | Status: DC

## 2023-02-04 MED ORDER — IRBESARTAN 150 MG PO TABS: 150.0000 mg | ORAL_TABLET | Freq: Every day | ORAL | 3 refills | Status: DC

## 2023-02-04 MED ORDER — METOPROLOL TARTRATE 25 MG PO TABS: 25.0000 mg | ORAL_TABLET | Freq: Two times a day (BID) | ORAL | 3 refills | Status: AC

## 2023-02-04 NOTE — Addendum Note (Signed)
Addended by: Jeannette How A on: 02/04/2023 12:06 PM   Modules accepted: Orders

## 2023-02-04 NOTE — Patient Instructions (Signed)
Medication Instructions:  Start Avapro 150 mg daily. Script sent. *If you need a refill on your cardiac medications before your next appointment, please call your pharmacy*   Lab Work: FASTING BMET, Lipid and Liver panel in one week. If you have labs (blood work) drawn today and your tests are completely normal, you will receive your results only by: MyChart Message (if you have MyChart) OR A paper copy in the mail If you have any lab test that is abnormal or we need to change your treatment, we will call you to review the results.    Follow-Up: At [facility], you and your health needs are our priority.  As part of our continuing mission to provide you with exceptional heart care, we have created designated Provider Care Teams.  These Care Teams include your primary Cardiologist (physician) and Advanced Practice Providers (APPs -  Physician Assistants and Nurse Practitioners) who all work together to provide you with the care you need, when you need it.  We recommend signing up for the patient portal called "MyChart".  Sign up information is provided on this After Visit Summary.  MyChart is used to connect with patients for Virtual Visits (Telemedicine).  Patients are able to view lab/test results, encounter notes, upcoming appointments, etc.  Non-urgent messages can be sent to your provider as well.   To learn more about what you can do with MyChart, go to [email or web address].    Your next appointment:   12 month(s)  Provider:   Olga Millers, MD

## 2023-02-12 LAB — BASIC METABOLIC PANEL
BUN/Creatinine Ratio: 18 (ref 10–24)
BUN: 22 mg/dL (ref 8–27)
CO2: 20 mmol/L (ref 20–29)
Calcium: 9.4 mg/dL (ref 8.6–10.2)
Chloride: 104 mmol/L (ref 96–106)
Creatinine, Ser: 1.2 mg/dL (ref 0.76–1.27)
Glucose: 134 mg/dL — ABNORMAL HIGH (ref 70–99)
Potassium: 4.6 mmol/L (ref 3.5–5.2)
Sodium: 142 mmol/L (ref 134–144)
eGFR: 62 mL/min/{1.73_m2} (ref >59)

## 2023-02-12 LAB — HEPATIC FUNCTION PANEL
ALT: 15 [IU]/L (ref 0–44)
AST: 17 [IU]/L (ref 0–40)
Albumin: 4.4 g/dL (ref 3.8–4.8)
Alkaline Phosphatase: 46 [IU]/L (ref 44–121)
Bilirubin Total: 0.6 mg/dL (ref 0.0–1.2)
Bilirubin, Direct: 0.16 mg/dL (ref 0.00–0.40)
Total Protein: 6.9 g/dL (ref 6.0–8.5)

## 2023-02-12 LAB — LIPID PANEL
Chol/HDL Ratio: 5.9 ratio — ABNORMAL HIGH (ref 0.0–5.0)
Cholesterol, Total: 170 mg/dL (ref 100–199)
HDL: 29 mg/dL — ABNORMAL LOW (ref >39)
LDL Chol Calc (NIH): 103 mg/dL — ABNORMAL HIGH (ref 0–99)
Triglycerides: 216 mg/dL — ABNORMAL HIGH (ref 0–149)
VLDL Cholesterol Cal: 38 mg/dL (ref 5–40)

## 2023-02-26 ENCOUNTER — Encounter: Payer: Self-pay | Admitting: *Deleted

## 2023-05-27 ENCOUNTER — Other Ambulatory Visit: Payer: Self-pay | Admitting: Cardiology

## 2024-02-22 ENCOUNTER — Other Ambulatory Visit: Payer: Self-pay | Admitting: Cardiology

## 2024-03-13 ENCOUNTER — Other Ambulatory Visit: Payer: Self-pay | Admitting: Cardiology

## 2024-03-13 DIAGNOSIS — I10 Essential (primary) hypertension: Secondary | ICD-10-CM

## 2024-03-16 ENCOUNTER — Telehealth: Payer: Self-pay | Admitting: Cardiology

## 2024-03-16 DIAGNOSIS — I10 Essential (primary) hypertension: Secondary | ICD-10-CM

## 2024-03-16 MED ORDER — IRBESARTAN 150 MG PO TABS: 150.0000 mg | ORAL_TABLET | Freq: Every day | ORAL | 2 refills | Status: AC

## 2024-03-16 NOTE — Telephone Encounter (Signed)
Refills sent until appointment

## 2024-03-16 NOTE — Telephone Encounter (Signed)
*  STAT* If patient is at the pharmacy, call can be transferred to refill team.   1. Which medications need to be refilled? (please list name of each medication and dose if known) irbesartan  (AVAPRO ) 150 MG tablet    2. Would you like to learn more about the convenience, safety, & potential cost savings by using the [facility]?no    3. Are you open to using the [facility] (Type [facility]. ). no   4. Which pharmacy/location (including street and city if local pharmacy) is medication to be sent to? [id number] - [place], [place] - [address]     5. Do they need a 30 day or 90 day supply? 30 day     Pt is out of medication and has scheduled appt in 3/26

## 2024-03-21 ENCOUNTER — Other Ambulatory Visit: Payer: Self-pay | Admitting: Cardiology

## 2024-06-09 ENCOUNTER — Ambulatory Visit: Admitting: Cardiology
